# Patient Record
Sex: Male | Born: 1970 | Race: White | Hispanic: No | Marital: Married | Smoking: Current every day smoker
Health system: Southern US, Community
[De-identification: ages and names within clinical notes are randomized; demographics above are authoritative.]

## PROBLEM LIST (undated history)

## (undated) DIAGNOSIS — J45909 Unspecified asthma, uncomplicated: Secondary | ICD-10-CM

## (undated) DIAGNOSIS — J9383 Other pneumothorax: Secondary | ICD-10-CM

## (undated) DIAGNOSIS — G43909 Migraine, unspecified, not intractable, without status migrainosus: Secondary | ICD-10-CM

## (undated) HISTORY — PX: ROTATOR CUFF REPAIR: SHX139

---

## 2004-06-29 ENCOUNTER — Emergency Department: Payer: Self-pay | Admitting: Emergency Medicine

## 2004-09-04 ENCOUNTER — Ambulatory Visit: Payer: Self-pay | Admitting: Physician Assistant

## 2004-11-11 ENCOUNTER — Ambulatory Visit: Payer: Self-pay | Admitting: Unknown Physician Specialty

## 2007-06-28 ENCOUNTER — Ambulatory Visit: Payer: Self-pay | Admitting: Specialist

## 2007-07-30 ENCOUNTER — Other Ambulatory Visit: Payer: Self-pay

## 2007-07-30 ENCOUNTER — Emergency Department: Payer: Self-pay | Admitting: Emergency Medicine

## 2008-10-30 ENCOUNTER — Ambulatory Visit: Payer: Self-pay | Admitting: Orthopedic Surgery

## 2008-11-05 ENCOUNTER — Ambulatory Visit: Payer: Self-pay | Admitting: Orthopedic Surgery

## 2015-11-02 ENCOUNTER — Emergency Department
Admission: EM | Admit: 2015-11-02 | Discharge: 2015-11-02 | Disposition: A | Discharge status: Home / Self Care | Payer: Self-pay | Attending: Emergency Medicine | Admitting: Emergency Medicine

## 2015-11-02 ENCOUNTER — Encounter: Payer: Self-pay | Admitting: Emergency Medicine

## 2015-11-02 DIAGNOSIS — G43109 Migraine with aura, not intractable, without status migrainosus: Secondary | ICD-10-CM | POA: Insufficient documentation

## 2015-11-02 DIAGNOSIS — F17200 Nicotine dependence, unspecified, uncomplicated: Secondary | ICD-10-CM | POA: Insufficient documentation

## 2015-11-02 HISTORY — DX: Other pneumothorax: J93.83

## 2015-11-02 HISTORY — DX: Migraine, unspecified, not intractable, without status migrainosus: G43.909

## 2015-11-02 MED ORDER — DIPHENHYDRAMINE HCL 50 MG/ML IJ SOLN
INTRAMUSCULAR | Status: AC
  Administered 2015-11-02: 50 mg via INTRAVENOUS
  Filled 2015-11-02: qty 1

## 2015-11-02 MED ORDER — KETOROLAC TROMETHAMINE 30 MG/ML IJ SOLN
30.0000 mg | Freq: Once | INTRAMUSCULAR | Status: AC
  Administered 2015-11-02: 30 mg via INTRAVENOUS

## 2015-11-02 MED ORDER — METOCLOPRAMIDE HCL 5 MG/ML IJ SOLN
10.0000 mg | Freq: Once | INTRAMUSCULAR | Status: AC
  Administered 2015-11-02: 10 mg via INTRAVENOUS

## 2015-11-02 MED ORDER — ONDANSETRON 4 MG PO TBDP
ORAL_TABLET | ORAL | Status: AC
  Filled 2015-11-02: qty 1

## 2015-11-02 MED ORDER — METOCLOPRAMIDE HCL 5 MG/ML IJ SOLN
INTRAMUSCULAR | Status: AC
  Administered 2015-11-02: 10 mg via INTRAVENOUS
  Filled 2015-11-02: qty 2

## 2015-11-02 MED ORDER — SODIUM CHLORIDE 0.9 % IV BOLUS (SEPSIS)
1000.0000 mL | Freq: Once | INTRAVENOUS | Status: AC
  Administered 2015-11-02: 1000 mL via INTRAVENOUS

## 2015-11-02 MED ORDER — KETOROLAC TROMETHAMINE 30 MG/ML IJ SOLN
INTRAMUSCULAR | Status: AC
  Administered 2015-11-02: 30 mg via INTRAVENOUS
  Filled 2015-11-02: qty 1

## 2015-11-02 MED ORDER — ONDANSETRON 4 MG PO TBDP
4.0000 mg | ORAL_TABLET | Freq: Once | ORAL | Status: AC | PRN
  Administered 2015-11-02: 4 mg via ORAL

## 2015-11-02 MED ORDER — DIPHENHYDRAMINE HCL 50 MG/ML IJ SOLN
50.0000 mg | Freq: Once | INTRAMUSCULAR | Status: AC
  Administered 2015-11-02: 50 mg via INTRAVENOUS

## 2015-11-02 NOTE — ED Notes (Signed)
Pt has migraine for 3 days.  Has hx migraines.  Does not have any prescription medication any more.  Came up last night but reports he was told wait was 2 hours and he did not want to wait, so he went home to go to sleep and come back to day.  Has had vomiting 4 times today.  Has kept some liquids down as long as not moving a lot.  Has photophobia.

## 2015-11-02 NOTE — ED Provider Notes (Signed)
[facility] Emergency Department Provider Note  Time seen: 3:45 PM  I have reviewed the triage vital signs and the nursing notes.   HISTORY  Chief Complaint Migraine    HPI NOMAR BROAD is a 45 y.o. male with a past medical history of migraines who presents the emergency department approximately 2-3 days of headache. According to the patient approximately 3 days ago he was driving when he started seeing flashing lights then developed a headache, nausea and vomiting. States a history of migraines which feel similar to to his current headache. States he missed work yesterday, and his boss stated that if he did not bring a work note he would be fired for missing today. Patient states he came to the emergency department hoping for relief from the headache but also requires a note. Denies any weakness or numbness. Currently describes the headache as moderate to severe. Dull aching headache, global.     Past Medical History  Diagnosis Date  . Migraine   . Spontaneous pneumothorax     There are no active problems to display for this patient.   History reviewed. No pertinent past surgical history.  No current outpatient prescriptions on file.  Allergies Review of patient's allergies indicates no known allergies.  History reviewed. No pertinent family history.  Social History Social History  Substance Use Topics  . Smoking status: Current Every Day Smoker  . Smokeless tobacco: None  . Alcohol Use: No    Review of Systems Constitutional: Negative for fever. Eyes: Patient states flashing lights before headache started. Cardiovascular: Negative for chest pain. Respiratory: Negative for shortness of breath. Gastrointestinal: Negative for abdominal pain. Positive for nausea. Neurological: Significant headache. Denies focal weakness or numbness 10-point ROS otherwise negative.  ____________________________________________   PHYSICAL  EXAM:  VITAL SIGNS: ED Triage Vitals  Enc Vitals Group     BP 11/02/15 1502 141/91 mmHg     Pulse Rate 11/02/15 1502 78     Resp 11/02/15 1502 16     Temp 11/02/15 1502 98.4 F (36.9 C)     Temp Source 11/02/15 1502 Oral     SpO2 11/02/15 1502 96 %     Weight 11/02/15 1502 135 lb (61.236 kg)     Height 11/02/15 1502  (1.727 m)     Head Cir --      Peak Flow --      Pain Score 11/02/15 1502 9     Pain Loc --      Pain Edu? --      Excl. in GC? --     Constitutional: Alert and oriented. Well appearing and in no distress. Eyes: Moderate photophobia. ENT   Head: Normocephalic and atraumatic   Mouth/Throat: Mucous membranes are moist. Cardiovascular: Normal rate, regular rhythm. No murmur Respiratory: Normal respiratory effort without tachypnea nor retractions. Breath sounds are clear Gastrointestinal: Soft and nontender. No distention.  Musculoskeletal: Nontender with normal range of motion in all extremities.  Neurologic:  Normal speech and language. No gross focal neurologic deficits are appreciated. Equal grip strengths. No deficits identified. Skin:  Skin is warm, dry and intact.  Psychiatric: Mood and affect are normal. Speech and behavior are normal.   ____________________________________________     INITIAL IMPRESSION / ASSESSMENT AND PLAN / ED COURSE  Pertinent labs & imaging results that were available during my care of the patient were reviewed by me and considered in my medical decision making (see chart for details).  Patient presents with  symptoms consistent with a migraine headache. Intact neurologic exam, history of migraines. We will treat with a migraine cocktail of medications and monitor in the emergency department for improvement. Patient agreeable to plan.   ----------------------------------------- 4:41 PM on 11/02/2015 -----------------------------------------  Patient states he is feeling much better. We'll discharge  home. ____________________________________________   FINAL CLINICAL IMPRESSION(S) / ED DIAGNOSES  Migraine headache   Minna AntisKevin Deloise Marchant, MD 11/02/15 [phone]

## 2015-11-02 NOTE — Discharge Instructions (Signed)

## 2016-01-21 ENCOUNTER — Emergency Department
Admission: EM | Admit: 2016-01-21 | Discharge: 2016-01-21 | Disposition: A | Discharge status: Home / Self Care | Payer: Self-pay | Attending: Emergency Medicine | Admitting: Emergency Medicine

## 2016-01-21 ENCOUNTER — Encounter: Payer: Self-pay | Admitting: Emergency Medicine

## 2016-01-21 DIAGNOSIS — S70369A Insect bite (nonvenomous), unspecified thigh, initial encounter: Secondary | ICD-10-CM | POA: Insufficient documentation

## 2016-01-21 DIAGNOSIS — Y999 Unspecified external cause status: Secondary | ICD-10-CM | POA: Insufficient documentation

## 2016-01-21 DIAGNOSIS — S30861A Insect bite (nonvenomous) of abdominal wall, initial encounter: Secondary | ICD-10-CM | POA: Insufficient documentation

## 2016-01-21 DIAGNOSIS — Y929 Unspecified place or not applicable: Secondary | ICD-10-CM | POA: Insufficient documentation

## 2016-01-21 DIAGNOSIS — S20469A Insect bite (nonvenomous) of unspecified back wall of thorax, initial encounter: Secondary | ICD-10-CM | POA: Insufficient documentation

## 2016-01-21 DIAGNOSIS — W57XXXA Bitten or stung by nonvenomous insect and other nonvenomous arthropods, initial encounter: Secondary | ICD-10-CM | POA: Insufficient documentation

## 2016-01-21 DIAGNOSIS — Y939 Activity, unspecified: Secondary | ICD-10-CM | POA: Insufficient documentation

## 2016-01-21 DIAGNOSIS — F17200 Nicotine dependence, unspecified, uncomplicated: Secondary | ICD-10-CM | POA: Insufficient documentation

## 2016-01-21 MED ORDER — PREDNISONE 10 MG PO TABS: ORAL_TABLET | ORAL | Status: DC

## 2016-01-21 MED ORDER — TRIAMCINOLONE ACETONIDE 0.5 % EX OINT: 1.0000 "application " | TOPICAL_OINTMENT | Freq: Three times a day (TID) | CUTANEOUS | Status: DC

## 2016-01-21 NOTE — ED Notes (Signed)
Pt presents to ED with rash to back, leg and groin. Pt denies pain. Pt states he first thought they were mosquito bites. Pt states rash itches. Pt speaking in complete sentences and in no apparent distress in triage.

## 2016-01-21 NOTE — ED Notes (Signed)
States he developed a rash couple of days ago. Pt has a large area to groin and to right side of back with a fine rash to arms and legs no resp distress noted

## 2016-01-21 NOTE — Discharge Instructions (Signed)
Follow-up with [facility] if any continued problems. Begin taking prednisone 3 tablets today and every day until completely finished. Kenalog cream to areas to decrease itching. You may also take Benadryl one or 2 capsules every 6 hours as needed for itching. Do not take hot showers as this may increase your itching.

## 2016-01-21 NOTE — ED Provider Notes (Signed)
[facility] Emergency Department Provider Note   ____________________________________________  Time seen: Approximately 7:15 AM  I have reviewed the triage vital signs and the nursing notes.   HISTORY  Chief Complaint Rash   HPI Walter Morales is a 45 y.o. male patient is here with complaint of "rash" for several days. Patient states that he has multiple areas on the right side of his back and one area on his leg/groin area. Patient states that rash itches. Patient states that first he thought that these were mosquito bites but they have gotten more red during the time he's been scratching at them. He denies any fever or chills. He denies removal of any ticks. He denies any headache. Patient has not been taking any medication for itching.   Past Medical History  Diagnosis Date  . Migraine   . Spontaneous pneumothorax     There are no active problems to display for this patient.   History reviewed. No pertinent past surgical history.  Current Outpatient Rx  Name  Route  Sig  Dispense  Refill  . predniSONE (DELTASONE) 10 MG tablet      Take 3 tablets once a day for 3 days   9 tablet   0   . triamcinolone ointment (KENALOG) 0.5 %   Topical   Apply 1 application topically 3 (three) times daily.   15 g   0     Allergies Review of patient's allergies indicates no known allergies.  No family history on file.  Social History Social History  Substance Use Topics  . Smoking status: Current Every Day Smoker  . Smokeless tobacco: None  . Alcohol Use: No    Review of Systems Constitutional: No fever/chills Eyes: No visual changes. ENT: No sore throat.Negative for difficulty swallowing. Cardiovascular: Denies chest pain. Respiratory: Denies shortness of breath. Negative for wheezing. Gastrointestinal: No abdominal pain.  No nausea, no vomiting.   Musculoskeletal: Negative for back pain. Skin: Positive for rash. Neurological:  Negative for headaches, focal weakness or numbness.  10-point ROS otherwise negative.  ____________________________________________   PHYSICAL EXAM:  VITAL SIGNS: ED Triage Vitals  Enc Vitals Group     BP 01/21/16 0710 128/79 mmHg     Pulse Rate 01/21/16 0710 65     Resp 01/21/16 0710 18     Temp 01/21/16 0710 97.6 F (36.4 C)     Temp Source 01/21/16 0710 Oral     SpO2 01/21/16 0710 97 %     Weight 01/21/16 0710 135 lb (61.236 kg)     Height 01/21/16 0710  (1.727 m)     Head Cir --      Peak Flow --      Pain Score --      Pain Loc --      Pain Edu? --      Excl. in GC? --     Constitutional: Alert and oriented. Well appearing and in no acute distress. Eyes: Conjunctivae are normal. PERRL. EOMI. Head: Atraumatic. Nose: No congestion/rhinnorhea. Mouth/Throat: Mucous membranes are moist.  Oropharynx non-erythematous. Neck: No stridor.   Cardiovascular: Normal rate, regular rhythm. Grossly normal heart sounds.  Good peripheral circulation. Respiratory: Normal respiratory effort.  No retractions. Lungs CTAB. Musculoskeletal: No lower extremity tenderness nor edema.  No joint effusions. Neurologic:  Normal speech and language. No gross focal neurologic deficits are appreciated. No gait instability. Skin:  Skin is warm, dry. There are individual red, papules on the areas described above.  There is no pustules or surrounding erythema suggesting cellulitis. Each area has the appearance of an insect bite. No foreign bodies were noted. Psychiatric: Mood and affect are normal. Speech and behavior are normal.  ____________________________________________   LABS (all labs ordered are listed, but only abnormal results are displayed)  Labs Reviewed - No data to display   PROCEDURES  Procedure(s) performed: None  Critical Care performed: No  ____________________________________________   INITIAL IMPRESSION / ASSESSMENT AND PLAN / ED COURSE  Pertinent labs & imaging  results that were available during my care of the patient were reviewed by me and considered in my medical decision making (see chart for details).  Patient was given a prescription for prednisone, Kenalog and told to continue taking over-the-counter Benadryl as needed for itching. Patient is [facility] or acute-care if any continued problems. ____________________________________________   FINAL CLINICAL IMPRESSION(S) / ED DIAGNOSES  Final diagnoses:  Multiple insect bites      NEW MEDICATIONS STARTED DURING THIS VISIT:  Discharge Medication List as of 01/21/2016  7:30 AM    START taking these medications   Details  predniSONE (DELTASONE) 10 MG tablet Take 3 tablets once a day for 3 days, Print    triamcinolone ointment (KENALOG) 0.5 % Apply 1 application topically 3 (three) times daily., Starting 01/21/2016, Until Discontinued, Print         Note:  This document was prepared using Dragon voice recognition software and may include unintentional dictation errors.    Tommi Rumpshonda L Summers, PA-C 01/21/16 1509  Phineas SemenGraydon Goodman, MD 01/21/16 [phone]

## 2016-01-26 ENCOUNTER — Emergency Department: Payer: Self-pay

## 2016-01-26 ENCOUNTER — Emergency Department
Admission: EM | Admit: 2016-01-26 | Discharge: 2016-01-26 | Disposition: A | Discharge status: Home / Self Care | Payer: Self-pay | Attending: Emergency Medicine | Admitting: Emergency Medicine

## 2016-01-26 DIAGNOSIS — G43109 Migraine with aura, not intractable, without status migrainosus: Secondary | ICD-10-CM | POA: Insufficient documentation

## 2016-01-26 DIAGNOSIS — F17200 Nicotine dependence, unspecified, uncomplicated: Secondary | ICD-10-CM | POA: Insufficient documentation

## 2016-01-26 LAB — COMPREHENSIVE METABOLIC PANEL
ALK PHOS: 72 U/L (ref 38–126)
ALT: 12 U/L — AB (ref 17–63)
AST: 21 U/L (ref 15–41)
Albumin: 3.9 g/dL (ref 3.5–5.0)
Anion gap: 6 (ref 5–15)
BUN: 9 mg/dL (ref 6–20)
CALCIUM: 9 mg/dL (ref 8.9–10.3)
CHLORIDE: 108 mmol/L (ref 101–111)
CO2: 25 mmol/L (ref 22–32)
CREATININE: 0.73 mg/dL (ref 0.61–1.24)
Glucose, Bld: 99 mg/dL (ref 65–99)
Potassium: 3.9 mmol/L (ref 3.5–5.1)
Sodium: 139 mmol/L (ref 135–145)
Total Bilirubin: 0.6 mg/dL (ref 0.3–1.2)
Total Protein: 6.7 g/dL (ref 6.5–8.1)

## 2016-01-26 LAB — CBC WITH DIFFERENTIAL/PLATELET
Basophils Absolute: 0.1 10*3/uL (ref 0–0.1)
Basophils Relative: 1 %
EOS ABS: 0.1 10*3/uL (ref 0–0.7)
EOS PCT: 2 %
HCT: 47.3 % (ref 40.0–52.0)
Hemoglobin: 16.1 g/dL (ref 13.0–18.0)
LYMPHS ABS: 2.5 10*3/uL (ref 1.0–3.6)
Lymphocytes Relative: 30 %
MCH: 31 pg (ref 26.0–34.0)
MCHC: 34 g/dL (ref 32.0–36.0)
MCV: 91.1 fL (ref 80.0–100.0)
MONOS PCT: 9 %
Monocytes Absolute: 0.8 10*3/uL (ref 0.2–1.0)
Neutro Abs: 4.9 10*3/uL (ref 1.4–6.5)
Neutrophils Relative %: 58 %
PLATELETS: 187 10*3/uL (ref 150–440)
RBC: 5.19 MIL/uL (ref 4.40–5.90)
RDW: 13.5 % (ref 11.5–14.5)
WBC: 8.4 10*3/uL (ref 3.8–10.6)

## 2016-01-26 MED ORDER — METOCLOPRAMIDE HCL 5 MG/ML IJ SOLN
10.0000 mg | Freq: Once | INTRAMUSCULAR | Status: AC
  Administered 2016-01-26: 10 mg via INTRAVENOUS
  Filled 2016-01-26: qty 2

## 2016-01-26 MED ORDER — KETOROLAC TROMETHAMINE 30 MG/ML IJ SOLN
30.0000 mg | Freq: Once | INTRAMUSCULAR | Status: AC
  Administered 2016-01-26: 30 mg via INTRAVENOUS
  Filled 2016-01-26: qty 1

## 2016-01-26 MED ORDER — DIPHENHYDRAMINE HCL 50 MG/ML IJ SOLN
25.0000 mg | Freq: Once | INTRAMUSCULAR | Status: AC
  Administered 2016-01-26: 25 mg via INTRAVENOUS
  Filled 2016-01-26: qty 1

## 2016-01-26 MED ORDER — SODIUM CHLORIDE 0.9 % IV BOLUS (SEPSIS)
1000.0000 mL | Freq: Once | INTRAVENOUS | Status: AC
  Administered 2016-01-26: 1000 mL via INTRAVENOUS

## 2016-01-26 MED ORDER — ONDANSETRON HCL 4 MG/2ML IJ SOLN
4.0000 mg | Freq: Once | INTRAMUSCULAR | Status: AC
  Administered 2016-01-26: 4 mg via INTRAVENOUS
  Filled 2016-01-26: qty 2

## 2016-01-26 NOTE — ED Notes (Signed)
States he is having to left side of head radiates into left side of neck for 3 days  Had some vomiting yesterday  Denies any fever or trauma

## 2016-01-26 NOTE — ED Notes (Signed)
Pt c/o left sided neck pain that is causing a HA for the past 3 days..Marland Kitchen

## 2016-01-26 NOTE — Discharge Instructions (Signed)
Migraine Headache A migraine headache is very bad, throbbing pain on one or both sides of your head. Talk to your doctor about what things may bring on (trigger) your migraine headaches. HOME CARE  Only take medicines as told by your doctor.  Lie down in a dark, quiet room when you have a migraine.  Keep a journal to find out if certain things bring on migraine headaches. For example, write down:  What you eat and drink.  How much sleep you get.  Any change to your diet or medicines.  Lessen how much alcohol you drink.  Quit smoking if you smoke.  Get enough sleep.  Lessen any stress in your life.  Keep lights dim if bright lights bother you or make your migraines worse. GET HELP RIGHT AWAY IF:   Your migraine becomes really bad.  You have a fever.  You have a stiff neck.  You have trouble seeing.  Your muscles are weak, or you lose muscle control.  You lose your balance or have trouble walking.  You feel like you will pass out (faint), or you pass out.  You have really bad symptoms that are different than your first symptoms. MAKE SURE YOU:   Understand these instructions.  Will watch your condition.  Will get help right away if you are not doing well or get worse.   This information is not intended to replace advice given to you by your health care provider. Make sure you discuss any questions you have with your health care provider.   Document Released: 05/04/2008 Document Revised: 10/18/2011 Document Reviewed: 04/02/2013 Elsevier Interactive Patient Education 2016 [employer]   [facility] if any continued problems with your headache. Increase fluids.

## 2016-01-26 NOTE — ED Provider Notes (Signed)
[facility] Emergency Department Provider Note  ____________________________________________  Time seen: Approximately 10:51 AM  I have reviewed the triage vital signs and the nursing notes.   HISTORY  Chief Complaint Neck Pain   HPI Walter Morales is a 45 y.o. male with a 30 year smoking history who presents to the emergency department with complaints of a headache and neck pain. The neck pain preceded the headache late last night and he expreienced nausea and vomiting this morning. He tried a hot shower and Asprin with no relief. Both the headache and neck pain are localized to the left side. He states that he has a history of migraines, but his last one was 2-3 years ago and they were usually relieved with Asprin. He admits to photophobia, floaters, and "flashers" in the left eye only, which are all usual for his past migraines. He denies recent trauma, recent illness, dizziness, fatigue, phonophobia, muscle pain or joint pain, and numbness or tingling in upper or lower extremities. He also admits to an occasional cough for the past month and occasional chest pain that is relieved with rest. Currently patient rates his pain as 8 out of 10.  He was seen in this emergency department last week for insect bites and was given a topical steroid and has experienced relief. He denies recent tick bite.    Past Medical History  Diagnosis Date  . Migraine   . Spontaneous pneumothorax     There are no active problems to display for this patient.   History reviewed. No pertinent past surgical history.  Current Outpatient Rx  Name  Route  Sig  Dispense  Refill  . predniSONE (DELTASONE) 10 MG tablet      Take 3 tablets once a day for 3 days   9 tablet   0   . triamcinolone ointment (KENALOG) 0.5 %   Topical   Apply 1 application topically 3 (three) times daily.   15 g   0     Allergies Review of patient's allergies indicates no known allergies.  No  family history on file.  Social History Social History  Substance Use Topics  . Smoking status: Current Every Day Smoker  . Smokeless tobacco: None  . Alcohol Use: No    Review of Systems Constitutional: No fever/chills Eyes: To give the positive for floaters and flashing. ENT: No sore throat. Cardiovascular: Denies chest pain. Respiratory: Denies shortness of breath. Gastrointestinal: No abdominal pain.  Positive nausea, positive vomiting.   Musculoskeletal: Negative for back pain. Skin: Negative for rash. Neurological: Positive for left-sided headaches, no focal weakness or numbness.  10-point ROS otherwise negative.  ____________________________________________   PHYSICAL EXAM:  VITAL SIGNS: ED Triage Vitals  Enc Vitals Group     BP 01/26/16 1045 109/81 mmHg     Pulse Rate 01/26/16 1045 72     Resp 01/26/16 1045 16     Temp 01/26/16 1045 97.7 F (36.5 C)     Temp Source 01/26/16 1045 Oral     SpO2 01/26/16 1045 95 %     Weight 01/26/16 1045 135 lb (61.236 kg)     Height 01/26/16 1045 5\' 8"  (1.727 m)     Head Cir --      Peak Flow --      Pain Score 01/26/16 1045 8     Pain Loc --      Pain Edu? --      Excl. in GC? --  Constitutional: Alert and oriented. Well appearing and in no acute distress. Eyes: Conjunctivae are normal. PERRL. EOMI. Head: Atraumatic. Nose: No congestion/rhinnorhea. Mouth/Throat: Mucous membranes are moist.  Oropharynx non-erythematous. Neck: No stridor.  Cervical spine on palpation there is no point tenderness on palpation there is some generalized soft tissue tenderness noted mostly on the left lateral side. There is no restriction with range of motion of the neck. Hematological/Lymphatic/Immunilogical: No cervical lymphadenopathy. Cardiovascular: Normal rate, regular rhythm. Grossly normal heart sounds.  Good peripheral circulation. Respiratory: Normal respiratory effort.  No retractions. Lungs CTAB. Musculoskeletal: Moves upper  and lower extremities without any difficulty. Normal gait was noted. Neurologic:  Normal speech and language. No gross focal neurologic deficits are appreciated. No gait instability. Cranial nerves II through XII grossly intact. Reflexes patella tendons 2+ bilaterally. Skin:  Skin is warm, dry and intact. No rash noted. Psychiatric: Mood and affect are normal. Speech and behavior are normal.  ____________________________________________   LABS (all labs ordered are listed, but only abnormal results are displayed)  Labs Reviewed  COMPREHENSIVE METABOLIC PANEL - Abnormal; Notable for the following:    ALT 12 (*)    All other components within normal limits  CBC WITH DIFFERENTIAL/PLATELET    RADIOLOGY CT head per radiologist shows no evidence of acute intracranial abnormality. Chest x-ray per radiologist shows biapical scarring.  I, Tommi Rumps, personally viewed and evaluated these images (plain radiographs) as part of my medical decision making, as well as reviewing the written report by the radiologist.  ____________________________________________   PROCEDURES  Procedure(s) performed: None  Critical Care performed: No  ____________________________________________   INITIAL IMPRESSION / ASSESSMENT AND PLAN / ED COURSE  Pertinent labs & imaging results that were available during my care of the patient were reviewed by me and considered in my medical decision making (see chart for details).  Patient was given IV fluids while in the emergency room and Cipro and was given prior to his CT scan which had alleviated his nausea. After a liter of normal saline, Toradol 30 mg IV, Reglan 10 mg IV and Benadryl 25 g IV patient experienced a remarked decrease in his headache and was able to tolerate light, drink fluids and was ready to go home. Patient is follow-up with his primary care doctor or [facility] if any continued problems with headaches .  FINAL CLINICAL IMPRESSION(S) /  ED DIAGNOSES  Final diagnoses:  Migraine with aura and without status migrainosus, not intractable      NEW MEDICATIONS STARTED DURING THIS VISIT:  Discharge Medication List as of 01/26/2016  1:47 PM       Note:  This document was prepared using Dragon voice recognition software and may include unintentional dictation errors.    Tommi Rumps, PA-C 01/26/16 1618  Sharman Cheek, MD 01/27/16 2014

## 2016-03-17 ENCOUNTER — Emergency Department
Admission: EM | Admit: 2016-03-17 | Discharge: 2016-03-17 | Disposition: A | Discharge status: Home / Self Care | Payer: Self-pay | Attending: Emergency Medicine | Admitting: Emergency Medicine

## 2016-03-17 ENCOUNTER — Encounter: Payer: Self-pay | Admitting: *Deleted

## 2016-03-17 DIAGNOSIS — F17200 Nicotine dependence, unspecified, uncomplicated: Secondary | ICD-10-CM | POA: Insufficient documentation

## 2016-03-17 DIAGNOSIS — R519 Headache, unspecified: Secondary | ICD-10-CM

## 2016-03-17 DIAGNOSIS — R51 Headache: Secondary | ICD-10-CM

## 2016-03-17 DIAGNOSIS — G44219 Episodic tension-type headache, not intractable: Secondary | ICD-10-CM | POA: Insufficient documentation

## 2016-03-17 MED ORDER — BUTALBITAL-APAP-CAFFEINE 50-325-40 MG PO TABS
1.0000 | ORAL_TABLET | Freq: Once | ORAL | Status: AC
  Administered 2016-03-17: 1 via ORAL
  Filled 2016-03-17: qty 1

## 2016-03-17 MED ORDER — BUTALBITAL-APAP-CAFFEINE 50-325-40 MG PO TABS: 1.0000 | ORAL_TABLET | Freq: Four times a day (QID) | ORAL | 0 refills | Status: DC | PRN

## 2016-03-17 NOTE — ED Provider Notes (Signed)
[facility] Emergency Department Provider Note  ____________________________________________  Time seen: Approximately 9:02 PM  I have reviewed the triage vital signs and the nursing notes.   HISTORY  Chief Complaint Migraine    HPI Walter Morales is a 45 y.o. male since for evaluation of a migraine headache. Patient states he's had a headache sudden onset this morning of floaters noted his eye but as the days progressed he states that he feels much better. He is scheduled for an eye exam tomorrow. Negative loss of consciousness slurred speech numbness or tingling.   Past Medical History:  Diagnosis Date  . Migraine   . Spontaneous pneumothorax     There are no active problems to display for this patient.   No past surgical history on file.  Prior to Admission medications   Medication Sig Start Date End Date Taking? Authorizing Provider  butalbital-acetaminophen-caffeine (FIORICET) 50-325-40 MG tablet Take 1-2 tablets by mouth every 6 (six) hours as needed for headache. 03/17/16   Evangeline Dakin, PA-C  predniSONE (DELTASONE) 10 MG tablet Take 3 tablets once a day for 3 days 01/21/16   Tommi Rumps, PA-C  triamcinolone ointment (KENALOG) 0.5 % Apply 1 application topically 3 (three) times daily. 01/21/16   Tommi Rumps, PA-C    Allergies Review of patient's allergies indicates no known allergies.  History reviewed. No pertinent family history.  Social History Social History  Substance Use Topics  . Smoking status: Current Every Day Smoker  . Smokeless tobacco: Not on file  . Alcohol use No    Review of Systems Constitutional: No fever/chills Eyes: No visual changes. ENT: No sore throat. Cardiovascular: Denies chest pain. Respiratory: Denies shortness of breath. Gastrointestinal: No abdominal pain.  No nausea, no vomiting.  No diarrhea.  No constipation. Genitourinary: Negative for dysuria. Musculoskeletal: Negative for back  pain. Skin: Negative for rash. Neurological: Positive for headaches, negative for focal weakness or numbness.  10-point ROS otherwise negative.  ____________________________________________   PHYSICAL EXAM:  VITAL SIGNS: ED Triage Vitals  Enc Vitals Group     BP 03/17/16 2015 126/79     Pulse Rate 03/17/16 2015 68     Resp 03/17/16 2015 18     Temp 03/17/16 2015 98.4 F (36.9 C)     Temp Source 03/17/16 2015 Oral     SpO2 03/17/16 2015 97 %     Weight 03/17/16 2015 135 lb (61.2 kg)     Height 03/17/16 2015  (1.753 m)     Head Circumference --      Peak Flow --      Pain Score 03/17/16 2017 7     Pain Loc --      Pain Edu? --      Excl. in GC? --     Constitutional: Alert and oriented. Well appearing and in no acute distress. Eyes: Conjunctivae are normal. PERRL. EOMI. Head: Atraumatic. Nose: No congestion/rhinnorhea. Mouth/Throat: Mucous membranes are moist.  Oropharynx non-erythematous. Neck: No stridor.   Cardiovascular: Normal rate, regular rhythm. Grossly normal heart sounds.  Good peripheral circulation. Respiratory: Normal respiratory effort.  No retractions. Lungs CTAB. Musculoskeletal: No lower extremity tenderness nor edema.  No joint effusions. Neurologic:  Normal speech and language. No gross focal neurologic deficits are appreciated. No gait instability. Skin:  Skin is warm, dry and intact. No rash noted. Psychiatric: Mood and affect are normal. Speech and behavior are normal.  ____________________________________________   LABS (all labs ordered are listed,  but only abnormal results are displayed)  Labs Reviewed - No data to display ____________________________________________  EKG   ____________________________________________  RADIOLOGY   ____________________________________________   PROCEDURES  Procedure(s) performed: None  Critical Care performed: No  ____________________________________________   INITIAL IMPRESSION /  ASSESSMENT AND PLAN / ED COURSE  Pertinent labs & imaging results that were available during my care of the patient were reviewed by me and considered in my medical decision making (see chart for details).  Probable tension headache. Rx given for Fioricet every 6 hours. Work excuse 24 hours given. Patient follow-up with his PCP keep his eye appointment as scheduled. He voices no other emergency medical complaints at this time.  Clinical Course    ____________________________________________   FINAL CLINICAL IMPRESSION(S) / ED DIAGNOSES  Final diagnoses:  Bad headache  Episodic tension-type headache, not intractable     This chart was dictated using voice recognition software/Dragon. Despite best efforts to proofread, errors can occur which can change the meaning. Any change was purely unintentional.    Evangeline Dakinharles M Johnie Makki, PA-C 03/17/16 2134    Sharman CheekPhillip Stafford, MD 03/20/16 0800

## 2016-03-17 NOTE — ED Triage Notes (Signed)
Pt ambulatory to triage.  Pt reports a headache after waking up this morning.  Pt took tylenol without pain relief.  Pt alert.  Speech clear.

## 2016-03-17 NOTE — ED Notes (Addendum)
Patient has hx of migraines and reports seeing stars during severe pain but at hospital right now is not experiencing that.

## 2016-05-25 ENCOUNTER — Encounter: Payer: Self-pay | Admitting: Emergency Medicine

## 2016-05-25 DIAGNOSIS — F17210 Nicotine dependence, cigarettes, uncomplicated: Secondary | ICD-10-CM | POA: Insufficient documentation

## 2016-05-25 DIAGNOSIS — Z5321 Procedure and treatment not carried out due to patient leaving prior to being seen by health care provider: Secondary | ICD-10-CM | POA: Insufficient documentation

## 2016-05-25 DIAGNOSIS — R5383 Other fatigue: Secondary | ICD-10-CM | POA: Insufficient documentation

## 2016-05-25 LAB — COMPREHENSIVE METABOLIC PANEL
ALBUMIN: 4.1 g/dL (ref 3.5–5.0)
ALT: 11 U/L — ABNORMAL LOW (ref 17–63)
ANION GAP: 9 (ref 5–15)
AST: 18 U/L (ref 15–41)
Alkaline Phosphatase: 78 U/L (ref 38–126)
BUN: 11 mg/dL (ref 6–20)
CHLORIDE: 103 mmol/L (ref 101–111)
CO2: 24 mmol/L (ref 22–32)
Calcium: 9.3 mg/dL (ref 8.9–10.3)
Creatinine, Ser: 0.72 mg/dL (ref 0.61–1.24)
GFR calc Af Amer: >60 mL/min (ref >60)
GFR calc non Af Amer: >60 mL/min (ref >60)
GLUCOSE: 117 mg/dL — AB (ref 65–99)
POTASSIUM: 3.6 mmol/L (ref 3.5–5.1)
SODIUM: 136 mmol/L (ref 135–145)
TOTAL PROTEIN: 7.7 g/dL (ref 6.5–8.1)
Total Bilirubin: 0.5 mg/dL (ref 0.3–1.2)

## 2016-05-25 LAB — CBC WITH DIFFERENTIAL/PLATELET
BASOS ABS: 0.1 10*3/uL (ref 0–0.1)
BASOS PCT: 1 %
EOS ABS: 0.1 10*3/uL (ref 0–0.7)
Eosinophils Relative: 1 %
HCT: 50.8 % (ref 40.0–52.0)
Hemoglobin: 17.1 g/dL (ref 13.0–18.0)
Lymphocytes Relative: 34 %
Lymphs Abs: 3.6 10*3/uL (ref 1.0–3.6)
MCH: 31.1 pg (ref 26.0–34.0)
MCHC: 33.7 g/dL (ref 32.0–36.0)
MCV: 92.4 fL (ref 80.0–100.0)
MONO ABS: 0.9 10*3/uL (ref 0.2–1.0)
MONOS PCT: 8 %
NEUTROS ABS: 5.9 10*3/uL (ref 1.4–6.5)
NEUTROS PCT: 56 %
Platelets: 253 10*3/uL (ref 150–440)
RBC: 5.5 MIL/uL (ref 4.40–5.90)
RDW: 13.9 % (ref 11.5–14.5)
WBC: 10.6 10*3/uL (ref 3.8–10.6)

## 2016-05-25 LAB — LIPASE, BLOOD: Lipase: 21 U/L (ref 11–51)

## 2016-05-25 LAB — ETHANOL: Alcohol, Ethyl (B): <5 mg/dL (ref <5)

## 2016-05-25 NOTE — ED Triage Notes (Addendum)
Pt presents to ED with extreme fatigue since last Thursday with decrease in appetite, body aches, frequent headaches, and rapid weight loss. Pt states he was around 145lbs and is currently 118lbs. Pt states he has just feeling badly and although he doesn't like going to the doctor he feels like he needs to get checked out since he is not improving. Denies v/d or fever.

## 2016-05-26 ENCOUNTER — Telehealth: Payer: Self-pay | Admitting: Emergency Medicine

## 2016-05-26 ENCOUNTER — Emergency Department
Admission: EM | Admit: 2016-05-26 | Discharge: 2016-05-26 | Disposition: A | Discharge status: Home / Self Care | Payer: Self-pay | Attending: Emergency Medicine | Admitting: Emergency Medicine

## 2016-05-26 ENCOUNTER — Emergency Department: Payer: Self-pay

## 2016-05-26 ENCOUNTER — Encounter: Payer: Self-pay | Admitting: *Deleted

## 2016-05-26 DIAGNOSIS — R079 Chest pain, unspecified: Secondary | ICD-10-CM | POA: Insufficient documentation

## 2016-05-26 DIAGNOSIS — M545 Low back pain: Secondary | ICD-10-CM | POA: Insufficient documentation

## 2016-05-26 DIAGNOSIS — R634 Abnormal weight loss: Secondary | ICD-10-CM | POA: Insufficient documentation

## 2016-05-26 DIAGNOSIS — R0602 Shortness of breath: Secondary | ICD-10-CM | POA: Insufficient documentation

## 2016-05-26 DIAGNOSIS — Z79899 Other long term (current) drug therapy: Secondary | ICD-10-CM | POA: Insufficient documentation

## 2016-05-26 DIAGNOSIS — F17210 Nicotine dependence, cigarettes, uncomplicated: Secondary | ICD-10-CM | POA: Insufficient documentation

## 2016-05-26 DIAGNOSIS — R630 Anorexia: Secondary | ICD-10-CM | POA: Insufficient documentation

## 2016-05-26 DIAGNOSIS — R5383 Other fatigue: Secondary | ICD-10-CM | POA: Insufficient documentation

## 2016-05-26 NOTE — ED Notes (Signed)
Pt states understanding of discharge instructions. NAD noted at this time.  

## 2016-05-26 NOTE — ED Provider Notes (Signed)
[facility] Emergency Department Provider Note   ____________________________________________    I have reviewed the triage vital signs and the nursing notes.   HISTORY  Chief Complaint Fatigue     HPI Walter SprayChristopher M Morales is a 45 y.o. male who presents with complaints of fatigue, decreased appetite and weight loss. He reports to me that this is been occurring over the last several months. He denies chest pain to me although I note that he did note intermittent chest pains to triage nurse. He denies shortness of breath today. He is a prior smoker. He denies fevers or chills. No cough. No nausea vomiting or abdominal pain.   Past Medical History:  Diagnosis Date  . Migraine   . Spontaneous pneumothorax     There are no active problems to display for this patient.   Past Surgical History:  Procedure Laterality Date  . ROTATOR CUFF REPAIR      Prior to Admission medications   Medication Sig Start Date End Date Taking? Authorizing Provider  butalbital-acetaminophen-caffeine (FIORICET) 50-325-40 MG tablet Take 1-2 tablets by mouth every 6 (six) hours as needed for headache. 03/17/16   Evangeline Dakinharles M Beers, PA-C  predniSONE (DELTASONE) 10 MG tablet Take 3 tablets once a day for 3 days 01/21/16   Tommi Rumpshonda L Summers, PA-C  triamcinolone ointment (KENALOG) 0.5 % Apply 1 application topically 3 (three) times daily. 01/21/16   Tommi Rumpshonda L Summers, PA-C     Allergies Review of patient's allergies indicates no known allergies.  History reviewed. No pertinent family history.  Social History Social History  Substance Use Topics  . Smoking status: Current Every Day Smoker    Packs/day: 0.50    Types: Cigarettes  . Smokeless tobacco: Former NeurosurgeonUser  . Alcohol use No    Review of Systems  Constitutional: No fever/chills. Weight loss as above Eyes: No visual changes.  ENT: No sore throat. Cardiovascular: Denies chest pain. Respiratory: Denies shortness of  breath. Gastrointestinal: No abdominal pain.  No nausea, no vomiting.   Genitourinary: Negative for dysuria. Musculoskeletal: Negative for back pain. Skin: Negative for rash. Neurological: Negative for headaches or weakness  10-point ROS otherwise negative.  ____________________________________________   PHYSICAL EXAM:  VITAL SIGNS: ED Triage Vitals  Enc Vitals Group     BP 05/26/16 1253 (!) 152/98     Pulse Rate 05/26/16 1253 (!) 102     Resp 05/26/16 1253 18     Temp 05/26/16 1253 97.7 F (36.5 C)     Temp Source 05/26/16 1253 Oral     SpO2 05/26/16 1253 98 %     Weight 05/26/16 1254 118 lb (53.5 kg)     Height 05/26/16 1254 5\' 8"  (1.727 m)     Head Circumference --      Peak Flow --      Pain Score 05/26/16 1254 8     Pain Loc --      Pain Edu? --      Excl. in GC? --     Constitutional: Alert and oriented. No acute distress.  Eyes: Conjunctivae are normal.   Nose: No congestion/rhinnorhea. Mouth/Throat: Mucous membranes are moist.    Cardiovascular: Normal rate, regular rhythm. Grossly normal heart sounds.  Good peripheral circulation. Respiratory: Normal respiratory effort.  No retractions. Lungs CTAB. Gastrointestinal: Soft and nontender. No distention.  No CVA tenderness. Genitourinary: deferred Musculoskeletal: No lower extremity tenderness nor edema.  Warm and well perfused Neurologic:  Normal speech and language. No gross  focal neurologic deficits are appreciated.  Skin:  Skin is warm, dry and intact. No rash noted. Psychiatric: Mood and affect are normal. Speech and behavior are normal.  ____________________________________________   LABS (all labs ordered are listed, but only abnormal results are displayed)  Labs Reviewed - No data to display ____________________________________________  EKG  ED ECG REPORT I, Jene Every, the attending physician, personally viewed and interpreted this ECG.  Date: 05/26/2016 EKG Time: 1 PM Rate:  92 Rhythm: normal sinus rhythm QRS Axis: Rightward Intervals: normal ST/T Wave abnormalities: normal Conduction Disturbances: none Narrative Interpretation: Pulmonary disease pattern  ____________________________________________  RADIOLOGY  Chest x-ray no nodules ____________________________________________   PROCEDURES  Procedure(s) performed: No    Critical Care performed: No ____________________________________________   INITIAL IMPRESSION / ASSESSMENT AND PLAN / ED COURSE  Pertinent labs & imaging results that were available during my care of the patient were reviewed by me and considered in my medical decision making (see chart for details).  Overall patient well-appearing with no emergent condition. He has no chest pain in the emergency department and denies chest pain today. Certainly his weight loss is concerning and I strongly encouraged outpatient follow-up for further evaluation  Clinical Course   ____________________________________________   FINAL CLINICAL IMPRESSION(S) / ED DIAGNOSES  Final diagnoses:  Fatigue, unspecified type  Weight loss      NEW MEDICATIONS STARTED DURING THIS VISIT:  Discharge Medication List as of 05/26/2016  3:33 PM       Note:  This document was prepared using Dragon voice recognition software and may include unintentional dictation errors.    Jene Every, MD 05/26/16 [phone]

## 2016-05-26 NOTE — ED Triage Notes (Signed)
Pt arrives with complaints of fatigue, states feeling weak, decrease in appetite, body aches, headaches and weight loss, states he was seen in ED last night but did not wait to see provider, pt awake and alert in no acute distress

## 2016-05-26 NOTE — ED Notes (Signed)
Pt in via triage with complaints of fatigue, decrease in appetite, weight loss over the last week.  Pt reports onset of intermittent chest pain since this morning w/ some shortness of breath and lower back pain.  Pt A/Ox4, no immediate distress at this time.

## 2016-07-16 ENCOUNTER — Emergency Department: Payer: Self-pay

## 2016-07-16 ENCOUNTER — Encounter: Payer: Self-pay | Admitting: Emergency Medicine

## 2016-07-16 ENCOUNTER — Emergency Department
Admission: EM | Admit: 2016-07-16 | Discharge: 2016-07-16 | Disposition: A | Discharge status: Home / Self Care | Payer: Self-pay | Attending: Emergency Medicine | Admitting: Emergency Medicine

## 2016-07-16 DIAGNOSIS — Z79899 Other long term (current) drug therapy: Secondary | ICD-10-CM | POA: Insufficient documentation

## 2016-07-16 DIAGNOSIS — J181 Lobar pneumonia, unspecified organism: Secondary | ICD-10-CM | POA: Insufficient documentation

## 2016-07-16 DIAGNOSIS — Z720 Tobacco use: Secondary | ICD-10-CM

## 2016-07-16 DIAGNOSIS — F17210 Nicotine dependence, cigarettes, uncomplicated: Secondary | ICD-10-CM | POA: Insufficient documentation

## 2016-07-16 DIAGNOSIS — J189 Pneumonia, unspecified organism: Secondary | ICD-10-CM

## 2016-07-16 DIAGNOSIS — M549 Dorsalgia, unspecified: Secondary | ICD-10-CM | POA: Insufficient documentation

## 2016-07-16 MED ORDER — METHYLPREDNISOLONE SODIUM SUCC 125 MG IJ SOLR
125.0000 mg | Freq: Once | INTRAMUSCULAR | Status: AC
  Administered 2016-07-16: 125 mg via INTRAMUSCULAR
  Filled 2016-07-16: qty 2

## 2016-07-16 MED ORDER — LEVOFLOXACIN 750 MG PO TABS: 750.0000 mg | ORAL_TABLET | Freq: Every day | ORAL | 0 refills | Status: DC

## 2016-07-16 MED ORDER — IPRATROPIUM-ALBUTEROL 0.5-2.5 (3) MG/3ML IN SOLN
3.0000 mL | Freq: Once | RESPIRATORY_TRACT | Status: AC
  Administered 2016-07-16: 3 mL via RESPIRATORY_TRACT
  Filled 2016-07-16: qty 3

## 2016-07-16 MED ORDER — LEVOFLOXACIN 500 MG PO TABS
750.0000 mg | ORAL_TABLET | Freq: Once | ORAL | Status: AC
  Administered 2016-07-16: 750 mg via ORAL
  Filled 2016-07-16: qty 1

## 2016-07-16 MED ORDER — PREDNISONE 10 MG PO TABS: ORAL_TABLET | ORAL | 0 refills | Status: DC

## 2016-07-16 MED ORDER — ALBUTEROL SULFATE HFA 108 (90 BASE) MCG/ACT IN AERS: 1.0000 | INHALATION_SPRAY | Freq: Four times a day (QID) | RESPIRATORY_TRACT | 0 refills | Status: DC | PRN

## 2016-07-16 NOTE — ED Triage Notes (Signed)
Pt ambulatory to triage, reports back pain, HA, and cold sx.  Pt reports sent home from work for evaluation.  Pt NAD at this time

## 2016-07-16 NOTE — ED Notes (Signed)
Pt Is having no signs of a reaction and is in no acute distress at this time.

## 2016-07-16 NOTE — ED Provider Notes (Signed)
[facility] Emergency Department Provider Note  ____________________________________________  Time seen: Approximately 8:45 PM  I have reviewed the triage vital signs and the nursing notes.   HISTORY  Chief Complaint Back Pain and Headache    HPI Walter Morales is a 45 y.o. male , NAD, presents emergency Department with 2-3 day history of cold symptoms. Patient states he has had sinus headache, sinus pressure, runny nose, cough, congestion and wheezing for approximately 3 days. States he had onset of right sided middle back pain over the last day or so. Has had no fevers, chills or body aches. Does smoke tobacco cigarettes on a daily basis but states over the last 3 months he has decreased to less than a pack a day as compared to a previous 2 pack per day habit. He also notes that over the last few days has been sick he has only smoked 3 cigarettes. Has had no abdominal pain, nausea, vomiting. Denies any rashes. No known sick contacts. Does note he was sent home from work due to his illness and needs to provide a medical note to return.   Past Medical History:  Diagnosis Date  . Migraine   . Spontaneous pneumothorax     There are no active problems to display for this patient.   Past Surgical History:  Procedure Laterality Date  . ROTATOR CUFF REPAIR      Prior to Admission medications   Medication Sig Start Date End Date Taking? Authorizing Provider  albuterol (PROVENTIL HFA;VENTOLIN HFA) 108 (90 Base) MCG/ACT inhaler Inhale 1-2 puffs into the lungs every 6 (six) hours as needed for wheezing or shortness of breath. 07/16/16   Leelynn Whetsel L Rayn Enderson, PA-C  butalbital-acetaminophen-caffeine (FIORICET) 50-325-40 MG tablet Take 1-2 tablets by mouth every 6 (six) hours as needed for headache. 03/17/16   Evangeline Dakinharles M Beers, PA-C  levofloxacin (LEVAQUIN) 750 MG tablet Take 1 tablet (750 mg total) by mouth daily. 07/16/16 07/23/16  Laniesha Das L Johnika Escareno, PA-C  predniSONE (DELTASONE)  10 MG tablet Take a daily regimen of 6,5,4,3,2,1 07/16/16   Fredy Gladu L Jeramy Dimmick, PA-C  triamcinolone ointment (KENALOG) 0.5 % Apply 1 application topically 3 (three) times daily. 01/21/16   Tommi Rumpshonda L Summers, PA-C    Allergies Patient has no known allergies.  History reviewed. No pertinent family history.  Social History Social History  Substance Use Topics  . Smoking status: Current Every Day Smoker    Packs/day: 0.50    Types: Cigarettes  . Smokeless tobacco: Former NeurosurgeonUser  . Alcohol use No     Review of Systems  Constitutional: No fever/chills Eyes: No visual changes. No discharge ENT: Positive nasal congestion, runny nose, sinus pressure. No ear pain, sore throat. Cardiovascular: No chest pain. Respiratory: Positive cough, chest congestion, wheezing. No shortness of breath Gastrointestinal: No abdominal pain.  No nausea, vomiting.  No diarrhea.   Musculoskeletal: Positive for back pain. Negative for general myalgias.  Skin: Negative for rash. Neurological: Positive for sinus headaches, but no focal weakness or numbness. 10-point ROS otherwise negative.  ____________________________________________   PHYSICAL EXAM:  VITAL SIGNS: ED Triage Vitals  Enc Vitals Group     BP 07/16/16 1956 122/80     Pulse Rate 07/16/16 1956 75     Resp 07/16/16 1956 18     Temp 07/16/16 1956 98.3 F (36.8 C)     Temp Source 07/16/16 1956 Oral     SpO2 07/16/16 1956 96 %     Weight 07/16/16 1957 135 lb (  61.2 kg)     Height 07/16/16 1957 5\' 8"  (1.727 m)     Head Circumference --      Peak Flow --      Pain Score 07/16/16 2007 6     Pain Loc --      Pain Edu? --      Excl. in GC? --      Constitutional: Alert and oriented. Well appearing and in no acute distress. Eyes: Conjunctivae are normal Without icterus or injection. Head: Atraumatic. ENT:      Ears: Right TM visualized without erythema, bulging, perforation or effusion. Left TM visualized with mild injection, moderate bulging,  trace effusion but no perforation.      Nose: Not congestion with trace clear rhinorrhea.      Mouth/Throat: Mucous membranes are moist. Nares without erythema, swelling, exudate. Airway is patent. Uvula is midline. Clear postnasal drip. Neck: Supple with full range of motion. Hematological/Lymphatic/Immunilogical: No cervical lymphadenopathy. Cardiovascular: Normal rate, regular rhythm. Normal S1 and S2.  Good peripheral circulation. Respiratory: Normal respiratory effort without tachypnea or retractions. Lungs with wheezes noted diffusely in bilateral lung fields but no rhonchi or rales. Breath sounds are heard throughout all lung fields. Musculoskeletal: Full range of motion of bilateral upper and lower studies without pain or difficulty. No tenderness to palpation of the thoracic or lumbar central spinal regions. No muscle spasms noted. Neurologic:  Normal speech and language. No gross focal neurologic deficits are appreciated.  Skin:  Skin is warm, dry and intact. No rash noted. Psychiatric: Mood and affect are normal. Speech and behavior are normal. Patient exhibits appropriate insight and judgement.   ____________________________________________   LABS  None ____________________________________________  EKG  None ____________________________________________  RADIOLOGY I, Hope PigeonJami L Parlee Amescua, personally viewed and evaluated these images (plain radiographs) as part of my medical decision making, as well as reviewing the written report by the radiologist.  Dg Chest 2 View  Result Date: 07/16/2016 CLINICAL DATA:  Cough and wheezing.  Back pain for 2 days. EXAM: CHEST  2 VIEW COMPARISON:  05/26/2016 FINDINGS: Chronic hyperinflation. Biapical pleuroparenchymal scarring and surgical change at the right lung apex. Developing ill-defined opacity in the left lung base concerning for pneumonia. Mild central bronchial thickening. Normal heart size and mediastinal contours. Normal pulmonary  vasculature. No pleural effusion or pneumothorax. IMPRESSION: Patchy left lung base opacity consistent with pneumonia. Followup PA and lateral chest X-ray is recommended in 3-4 weeks following trial of antibiotic therapy to ensure resolution and exclude underlying malignancy. Electronically Signed   By: Rubye OaksMelanie  Ehinger M.D.   On: 07/16/2016 21:24    ____________________________________________    PROCEDURES  Procedure(s) performed: None   Procedures   Medications  ipratropium-albuterol (DUONEB) 0.5-2.5 (3) MG/3ML nebulizer solution 3 mL (3 mLs Nebulization Given 07/16/16 2106)  methylPREDNISolone sodium succinate (SOLU-MEDROL) 125 mg/2 mL injection 125 mg (125 mg Intramuscular Given 07/16/16 2140)  levofloxacin (LEVAQUIN) tablet 750 mg (750 mg Oral Given 07/16/16 2140)     ____________________________________________   INITIAL IMPRESSION / ASSESSMENT AND PLAN / ED COURSE  Pertinent labs & imaging results that were available during my care of the patient were reviewed by me and considered in my medical decision making (see chart for details).  Clinical Course     Patient's diagnosis is consistent with Community-acquired pneumonia of left lower lobe and tobacco use. Patient's respiratory exam was improved after administration of DuoNeb breathing treatment. Patient notes his breathing is better and he feels more open. Patient was given  first dose of Levaquin in the emergency department as well as an IM injection of Solu-Medrol. Patient will be discharged home with prescriptions for Levaquin, prednisone Dosepak and albuterol inhaler to use as directed. Patient is to follow up with [facility] in 3 days if symptoms persist past this treatment course. Patient also understands that he will need to follow up with [facility] where the outpatient urgent care or primary care provider of his choice in 4 weeks for repeat chest x-ray. Patient is given ED precautions to return to  the ED for any worsening or new symptoms.    ____________________________________________  FINAL CLINICAL IMPRESSION(S) / ED DIAGNOSES  Final diagnoses:  Community acquired pneumonia of left lower lobe of lung (HCC)  Tobacco use      NEW MEDICATIONS STARTED DURING THIS VISIT:  New Prescriptions   ALBUTEROL (PROVENTIL HFA;VENTOLIN HFA) 108 (90 BASE) MCG/ACT INHALER    Inhale 1-2 puffs into the lungs every 6 (six) hours as needed for wheezing or shortness of breath.   LEVOFLOXACIN (LEVAQUIN) 750 MG TABLET    Take 1 tablet (750 mg total) by mouth daily.   PREDNISONE (DELTASONE) 10 MG TABLET    Take a daily regimen of 6,5,4,3,2,1         Hope Pigeon, PA-C 07/16/16 2144    Emily Filbert, MD 07/16/16 2244

## 2016-07-20 ENCOUNTER — Encounter: Payer: Self-pay | Admitting: Emergency Medicine

## 2016-07-20 ENCOUNTER — Emergency Department
Admission: EM | Admit: 2016-07-20 | Discharge: 2016-07-20 | Disposition: A | Discharge status: Home / Self Care | Payer: Self-pay | Attending: Emergency Medicine | Admitting: Emergency Medicine

## 2016-07-20 ENCOUNTER — Emergency Department: Payer: Self-pay

## 2016-07-20 DIAGNOSIS — J189 Pneumonia, unspecified organism: Secondary | ICD-10-CM

## 2016-07-20 DIAGNOSIS — F17210 Nicotine dependence, cigarettes, uncomplicated: Secondary | ICD-10-CM | POA: Insufficient documentation

## 2016-07-20 DIAGNOSIS — J181 Lobar pneumonia, unspecified organism: Secondary | ICD-10-CM | POA: Insufficient documentation

## 2016-07-20 LAB — BASIC METABOLIC PANEL
ANION GAP: 9 (ref 5–15)
BUN: 11 mg/dL (ref 6–20)
CALCIUM: 9.3 mg/dL (ref 8.9–10.3)
CO2: 26 mmol/L (ref 22–32)
CREATININE: 0.84 mg/dL (ref 0.61–1.24)
Chloride: 100 mmol/L — ABNORMAL LOW (ref 101–111)
GFR calc Af Amer: >60 mL/min (ref >60)
GFR calc non Af Amer: >60 mL/min (ref >60)
GLUCOSE: 130 mg/dL — AB (ref 65–99)
Potassium: 3.9 mmol/L (ref 3.5–5.1)
Sodium: 135 mmol/L (ref 135–145)

## 2016-07-20 LAB — INFLUENZA PANEL BY PCR (TYPE A & B)
INFLAPCR: NEGATIVE
INFLBPCR: NEGATIVE

## 2016-07-20 LAB — CBC WITH DIFFERENTIAL/PLATELET
BASOS ABS: 0.1 10*3/uL (ref 0–0.1)
BASOS PCT: 1 %
EOS PCT: 1 %
Eosinophils Absolute: 0.1 10*3/uL (ref 0–0.7)
HCT: 48.5 % (ref 40.0–52.0)
Hemoglobin: 16.9 g/dL (ref 13.0–18.0)
LYMPHS PCT: 17 %
Lymphs Abs: 2.6 10*3/uL (ref 1.0–3.6)
MCH: 31.4 pg (ref 26.0–34.0)
MCHC: 34.9 g/dL (ref 32.0–36.0)
MCV: 90 fL (ref 80.0–100.0)
MONO ABS: 1.2 10*3/uL — AB (ref 0.2–1.0)
MONOS PCT: 8 %
Neutro Abs: 11.6 10*3/uL — ABNORMAL HIGH (ref 1.4–6.5)
Neutrophils Relative %: 73 %
PLATELETS: 318 10*3/uL (ref 150–440)
RBC: 5.39 MIL/uL (ref 4.40–5.90)
RDW: 13.2 % (ref 11.5–14.5)
WBC: 15.6 10*3/uL — ABNORMAL HIGH (ref 3.8–10.6)

## 2016-07-20 MED ORDER — IPRATROPIUM-ALBUTEROL 0.5-2.5 (3) MG/3ML IN SOLN
3.0000 mL | Freq: Once | RESPIRATORY_TRACT | Status: AC
  Administered 2016-07-20: 3 mL via RESPIRATORY_TRACT
  Filled 2016-07-20: qty 3

## 2016-07-20 MED ORDER — LEVOFLOXACIN 750 MG PO TABS: 750.0000 mg | ORAL_TABLET | Freq: Every day | ORAL | 0 refills | Status: AC

## 2016-07-20 NOTE — ED Notes (Signed)
Pt states he feels like something is sitting on his chest and c/o chest pain/tightness - pt dx with pneumonia this week and states no relief from atb or inhalers - MD at bedside assessing pt

## 2016-07-20 NOTE — ED Provider Notes (Signed)
Time Seen: Approximately *1157  I have reviewed the triage notes  Chief Complaint: Cough and Chest Pain   History of Present Illness: Walter Morales is a 45 y.o. male who returns to the emergency department after outpatient treatment for community-acquired pneumonia. Patient has been on Levaquin and still has a couple doses left of Levaquin. He denies any fever though has had significant wheezing. His pulse oximetry is 99% on room air. He states lying flat seems to make shortness of breath worse. He denies any significant chest pain to this historian. Today embolism or congestive heart failure. He does have a history of a spontaneous pneumothorax.   Past Medical History:  Diagnosis Date  . Migraine   . Spontaneous pneumothorax     There are no active problems to display for this patient.   Past Surgical History:  Procedure Laterality Date  . ROTATOR CUFF REPAIR      Past Surgical History:  Procedure Laterality Date  . ROTATOR CUFF REPAIR      Current Outpatient Rx  . Order #: [id number] Class: Print  . Order #: [id number] Class: Print  . Order #: [id number] Class: Print  . Order #: [id number] Class: Print  . Order #: [id number] Class: Print  . Order #: [id number] Class: Print    Allergies:  Patient has no known allergies.  Family History: History reviewed. No pertinent family history.  Social History: Social History  Substance Use Topics  . Smoking status: Current Every Day Smoker    Packs/day: 0.50    Types: Cigarettes  . Smokeless tobacco: Former Neurosurgeon  . Alcohol use No     Review of Systems:   10 point review of systems was performed and was otherwise negative:  Constitutional: No fever Eyes: No visual disturbances ENT: No sore throat, ear pain Cardiac: No chest pain Respiratory: Shortness of breath with wheezing. No stridor. Persistent productive cough. No hemoptysis Abdomen: No abdominal pain, no vomiting, No diarrhea Endocrine: No weight loss, No  night sweats Extremities: No peripheral edema, cyanosis Skin: No rashes, easy bruising Neurologic: No focal weakness, trouble with speech or swollowing Urologic: No dysuria, Hematuria, or urinary frequency   Physical Exam:  ED Triage Vitals [07/20/16 1120]  Enc Vitals Group     BP (!) 124/92     Pulse Rate 99     Resp (!) 24     Temp 97.7 F (36.5 C)     Temp Source Oral     SpO2 99 %     Weight      Height      Head Circumference      Peak Flow      Pain Score 9     Pain Loc      Pain Edu?      Excl. in GC?     General: Awake , Alert , and Oriented times 3; GCS 15 Head: Normal cephalic , atraumatic Eyes: Pupils equal , round, reactive to light Nose/Throat: No nasal drainage, patent upper airway without erythema or exudate.  Neck: Supple, Full range of motion, No anterior adenopathy or palpable thyroid masses Lungs: Patient has some wheezing auscultated in all lung fields with some coarse breath sounds bilaterally at the bases no rales or rhonchi are noted  Heart: Regular rate, regular rhythm without murmurs , gallops , or rubs Abdomen: Soft, non tender without rebound, guarding , or rigidity; bowel sounds positive and symmetric in all 4 quadrants. No organomegaly .        Extremities: 2  plus symmetric pulses. No edema, clubbing or cyanosis Neurologic: normal ambulation, Motor symmetric without deficits, sensory intact Skin: warm, dry, no rashes   Labs:   All laboratory work was reviewed including any pertinent negatives or positives listed below:  Labs Reviewed  CBC WITH DIFFERENTIAL/PLATELET - Abnormal; Notable for the following:       Result Value   WBC 15.6 (*)    Neutro Abs 11.6 (*)    Monocytes Absolute 1.2 (*)    All other components within normal limits  BASIC METABOLIC PANEL - Abnormal; Notable for the following:    Chloride 100 (*)    Glucose, Bld 130 (*)    All other components within normal limits  INFLUENZA PANEL BY PCR (TYPE A & B, H1N1)    EKG:   ED ECG REPORT I, Jennye MoccasinBrian S Derotha Fishbaugh, the attending physician, personally viewed and interpreted this ECG.  Date: 07/20/2016 EKG Time: 1122 Rate: 91* Rhythm: normal sinus rhythm QRS Axis: normal Intervals: normal ST/T Wave abnormalities: Nonspecific ST-T wave abnormality Conduction Disturbances: none Narrative Interpretation: unremarkable    Radiology: "Dg Chest 2 View  Result Date: 07/20/2016 CLINICAL DATA:  Initial evaluation for acute chest pain. Recent diagnosis of pneumonia. EXAM: CHEST  2 VIEW COMPARISON:  Prior radiograph from 07/16/2016. FINDINGS: Cardiac and mediastinal silhouettes are stable in size and contour, and remain within normal limits. Lungs are mildly hyper loop plated. Previously seen patchy density at the peripheral left lower lobe is improved, with only minimal residual opacity now evident. Elevation of the right minor fissure is stable. No new focal infiltrates. No pulmonary edema or pleural effusion. No pneumothorax. Suture material noted at the right lung apex, stable. No acute osseous abnormality. IMPRESSION: 1. Interval improvement in previously identified patchy left lower lobe opacity as compared to 07/16/16, suggesting improved infiltrate/pneumonia. 2. No other active cardiopulmonary disease identified. Electronically Signed   By: Rise MuBenjamin  McClintock M.D.   On: 07/20/2016 12:34   Dg Chest 2 View  Result Date: 07/16/2016 CLINICAL DATA:  Cough and wheezing.  Back pain for 2 days. EXAM: CHEST  2 VIEW COMPARISON:  05/26/2016 FINDINGS: Chronic hyperinflation. Biapical pleuroparenchymal scarring and surgical change at the right lung apex. Developing ill-defined opacity in the left lung base concerning for pneumonia. Mild central bronchial thickening. Normal heart size and mediastinal contours. Normal pulmonary vasculature. No pleural effusion or pneumothorax. IMPRESSION: Patchy left lung base opacity consistent with pneumonia. Followup PA and lateral chest X-ray is  recommended in 3-4 weeks following trial of antibiotic therapy to ensure resolution and exclude underlying malignancy. Electronically Signed   By: Rubye OaksMelanie  Ehinger M.D.   On: 07/16/2016 21:24  " Chest x-ray repeat seemed to show some improvement of the patient's left lower lobe pneumonia I personally reviewed the radiologic studies    ED Course:  Patient's stay here was uneventful and appears that he has some resolving pneumonia with bronchospasm. Patient otherwise does not appear to be hypoxic nor septic at this time. I extended his course of Levaquin and he states he has enough inhalers at home and he was advised take stellate the usage of his inhaler if he feels short of breath. Clinical Course      Assessment:  Acute community-acquired pneumonia   Final Clinical Impression:   Final diagnoses:  Community acquired pneumonia of left lower lobe of lung (HCC)     Plan: * Outpatient Patient was advised to return immediately if condition worsens. Patient was advised to follow up with their primary care  physician or other specialized physicians involved in their outpatient care. The patient and/or family member/power of attorney had laboratory results reviewed at the bedside. All questions and concerns were addressed and appropriate discharge instructions were distributed by the nursing staff.             Jennye MoccasinBrian S Sandon Yoho, MD 07/20/16 [phone]

## 2016-07-20 NOTE — Discharge Instructions (Signed)
Please return immediately if condition worsens. Please contact her primary physician or the physician you were given for referral. If you have any specialist physicians involved in her treatment and plan please also contact them. Thank you for using [facility]. ° °

## 2016-07-20 NOTE — ED Triage Notes (Addendum)
Pt to ed with c/o chest pain that started on Friday night, was seen here dx with pneumonia.  Pt with audible wheezing at triage.  Appears in mild resp distress at triage, pt with heavy work of breathing  RR 24  Pt states laying flat makes sob worse.  sats 99%RA

## 2016-12-15 ENCOUNTER — Emergency Department: Payer: Self-pay

## 2016-12-15 ENCOUNTER — Encounter: Payer: Self-pay | Admitting: *Deleted

## 2016-12-15 ENCOUNTER — Emergency Department
Admission: EM | Admit: 2016-12-15 | Discharge: 2016-12-15 | Disposition: A | Discharge status: Home / Self Care | Payer: Self-pay | Attending: Emergency Medicine | Admitting: Emergency Medicine

## 2016-12-15 DIAGNOSIS — F17210 Nicotine dependence, cigarettes, uncomplicated: Secondary | ICD-10-CM | POA: Insufficient documentation

## 2016-12-15 DIAGNOSIS — R1031 Right lower quadrant pain: Secondary | ICD-10-CM | POA: Insufficient documentation

## 2016-12-15 DIAGNOSIS — J45909 Unspecified asthma, uncomplicated: Secondary | ICD-10-CM | POA: Insufficient documentation

## 2016-12-15 DIAGNOSIS — J189 Pneumonia, unspecified organism: Secondary | ICD-10-CM | POA: Insufficient documentation

## 2016-12-15 DIAGNOSIS — Z79899 Other long term (current) drug therapy: Secondary | ICD-10-CM | POA: Insufficient documentation

## 2016-12-15 HISTORY — DX: Unspecified asthma, uncomplicated: J45.909

## 2016-12-15 LAB — CBC
HCT: 47.5 % (ref 40.0–52.0)
Hemoglobin: 16.5 g/dL (ref 13.0–18.0)
MCH: 31.1 pg (ref 26.0–34.0)
MCHC: 34.8 g/dL (ref 32.0–36.0)
MCV: 89.2 fL (ref 80.0–100.0)
PLATELETS: 301 10*3/uL (ref 150–440)
RBC: 5.32 MIL/uL (ref 4.40–5.90)
RDW: 12.9 % (ref 11.5–14.5)
WBC: 12.8 10*3/uL — AB (ref 3.8–10.6)

## 2016-12-15 LAB — COMPREHENSIVE METABOLIC PANEL
ALT: 18 U/L (ref 17–63)
AST: 35 U/L (ref 15–41)
Albumin: 3.8 g/dL (ref 3.5–5.0)
Alkaline Phosphatase: 82 U/L (ref 38–126)
Anion gap: 10 (ref 5–15)
BILIRUBIN TOTAL: 0.7 mg/dL (ref 0.3–1.2)
BUN: 12 mg/dL (ref 6–20)
CALCIUM: 9.5 mg/dL (ref 8.9–10.3)
CO2: 25 mmol/L (ref 22–32)
Chloride: 102 mmol/L (ref 101–111)
Creatinine, Ser: 0.74 mg/dL (ref 0.61–1.24)
Glucose, Bld: 145 mg/dL — ABNORMAL HIGH (ref 65–99)
Potassium: 3.2 mmol/L — ABNORMAL LOW (ref 3.5–5.1)
Sodium: 137 mmol/L (ref 135–145)
TOTAL PROTEIN: 8.3 g/dL — AB (ref 6.5–8.1)

## 2016-12-15 LAB — URINALYSIS, COMPLETE (UACMP) WITH MICROSCOPIC
Bacteria, UA: NONE SEEN
Bilirubin Urine: NEGATIVE
Glucose, UA: NEGATIVE mg/dL
Hgb urine dipstick: NEGATIVE
KETONES UR: 5 mg/dL — AB
Leukocytes, UA: NEGATIVE
Nitrite: NEGATIVE
PH: 5 (ref 5.0–8.0)
Protein, ur: NEGATIVE mg/dL
Specific Gravity, Urine: 1.027 (ref 1.005–1.030)

## 2016-12-15 LAB — TYPE AND SCREEN
ABO/RH(D): O POS
Antibody Screen: NEGATIVE

## 2016-12-15 LAB — LIPASE, BLOOD: LIPASE: 19 U/L (ref 11–51)

## 2016-12-15 MED ORDER — LEVOFLOXACIN 500 MG PO TABS: 500.0000 mg | ORAL_TABLET | Freq: Every day | ORAL | 0 refills | Status: AC

## 2016-12-15 MED ORDER — MORPHINE SULFATE (PF) 2 MG/ML IV SOLN
INTRAVENOUS | Status: AC
  Administered 2016-12-15: 2 mg via INTRAVENOUS
  Filled 2016-12-15: qty 1

## 2016-12-15 MED ORDER — ONDANSETRON HCL 4 MG/2ML IJ SOLN
4.0000 mg | Freq: Once | INTRAMUSCULAR | Status: AC
  Administered 2016-12-15: 4 mg via INTRAVENOUS

## 2016-12-15 MED ORDER — IOPAMIDOL (ISOVUE-300) INJECTION 61%
100.0000 mL | Freq: Once | INTRAVENOUS | Status: AC | PRN
  Administered 2016-12-15: 100 mL via INTRAVENOUS
  Filled 2016-12-15: qty 100

## 2016-12-15 MED ORDER — ONDANSETRON HCL 4 MG/2ML IJ SOLN
INTRAMUSCULAR | Status: AC
  Administered 2016-12-15: 4 mg via INTRAVENOUS
  Filled 2016-12-15: qty 2

## 2016-12-15 MED ORDER — MORPHINE SULFATE (PF) 2 MG/ML IV SOLN
2.0000 mg | Freq: Once | INTRAVENOUS | Status: AC
  Administered 2016-12-15: 2 mg via INTRAVENOUS

## 2016-12-15 MED ORDER — IOPAMIDOL (ISOVUE-300) INJECTION 61%
30.0000 mL | Freq: Once | INTRAVENOUS | Status: AC | PRN
  Administered 2016-12-15: 30 mL via ORAL
  Filled 2016-12-15: qty 30

## 2016-12-15 NOTE — ED Notes (Signed)
ED Provider at bedside. 

## 2016-12-15 NOTE — ED Triage Notes (Signed)
C/O RLQ pain and diarrhea x 1 week.  Symptoms worsening and last night patient reports rectal bleeding.  Also c/o chills.

## 2016-12-15 NOTE — ED Notes (Signed)
Patient transported to CT 

## 2016-12-15 NOTE — ED Notes (Signed)
Pt finished contrast, CT called.

## 2016-12-15 NOTE — ED Provider Notes (Signed)
[facility] Emergency Department Provider Note   ____________________________________________    I have reviewed the triage vital signs and the nursing notes.   HISTORY  Chief Complaint Abdominal Pain     HPI Walter Morales is a 46 y.o. male who p/w rlq abd pain. He notes the pain is aching and moderate in nature. Pain started last night. Initially developed diarrhea 3 days ago and the pain began last night. No fevers/chills. No hx of abdominal surgery. Has not taken anything for the pain. No recent travel. No sick contacts. No cough or sob. No pleurisy   Past Medical History:  Diagnosis Date  . Asthma   . Migraine   . Spontaneous pneumothorax     There are no active problems to display for this patient.   Past Surgical History:  Procedure Laterality Date  . ROTATOR CUFF REPAIR      Prior to Admission medications   Medication Sig Start Date End Date Taking? Authorizing Provider  albuterol (PROVENTIL HFA;VENTOLIN HFA) 108 (90 Base) MCG/ACT inhaler Inhale 1-2 puffs into the lungs every 6 (six) hours as needed for wheezing or shortness of breath. 07/16/16   Hagler, Jami L, PA-C  butalbital-acetaminophen-caffeine (FIORICET) 50-325-40 MG tablet Take 1-2 tablets by mouth every 6 (six) hours as needed for headache. 03/17/16   Beers, Charmayne Sheer, PA-C  levofloxacin (LEVAQUIN) 500 MG tablet Take 1 tablet (500 mg total) by mouth daily. 12/15/16 12/25/16  Jene Every, MD  predniSONE (DELTASONE) 10 MG tablet Take a daily regimen of 6,5,4,3,2,1 07/16/16   Hagler, Jami L, PA-C  triamcinolone ointment (KENALOG) 0.5 % Apply 1 application topically 3 (three) times daily. 01/21/16   Tommi Rumps, PA-C     Allergies Patient has no known allergies.  No family history on file.  Social History Social History  Substance Use Topics  . Smoking status: Current Every Day Smoker    Packs/day: 0.50    Types: Cigarettes  . Smokeless tobacco: Former Neurosurgeon  .  Alcohol use No    Review of Systems  Constitutional: No fever/chills Eyes: No visual changes.  ENT: No sore throat. Cardiovascular: Denies chest pain. Respiratory: Denies shortness of breath. No cough Gastrointestinal: as above, no nausea Genitourinary: Negative for dysuria. Musculoskeletal: Negative for back pain. Skin: Negative for rash. Neurological: Negative for headaches    ____________________________________________   PHYSICAL EXAM:  VITAL SIGNS: ED Triage Vitals  Enc Vitals Group     BP 12/15/16 1124 137/74     Pulse Rate 12/15/16 1124 92     Resp 12/15/16 1124 16     Temp 12/15/16 1124 98.8 F (37.1 C)     Temp Source 12/15/16 1124 Oral     SpO2 12/15/16 1124 99 %     Weight 12/15/16 1125 145 lb (65.8 kg)     Height 12/15/16 1125 5\' 8"  (1.727 m)     Head Circumference --      Peak Flow --      Pain Score 12/15/16 1124 10     Pain Loc --      Pain Edu? --      Excl. in GC? --     Constitutional: Alert and oriented. No acute distress. Pleasant and interactive Eyes: Conjunctivae are normal.   Nose: No congestion/rhinnorhea. Mouth/Throat: Mucous membranes are moist.    Cardiovascular: Normal rate, regular rhythm. Grossly normal heart sounds.  Good peripheral circulation. Respiratory: Normal respiratory effort.  No retractions.  Gastrointestinal: Mild ttp  in the RLQ. No distention.  No CVA tenderness. Genitourinary: deferred Musculoskeletal: No lower extremity tenderness nor edema.  Warm and well perfused Neurologic:  Normal speech and language. No gross focal neurologic deficits are appreciated.  Skin:  Skin is warm, dry and intact. No rash noted. Psychiatric: Mood and affect are normal. Speech and behavior are normal.  ____________________________________________   LABS (all labs ordered are listed, but only abnormal results are displayed)  Labs Reviewed  COMPREHENSIVE METABOLIC PANEL - Abnormal; Notable for the following:       Result Value    Potassium 3.2 (*)    Glucose, Bld 145 (*)    Total Protein 8.3 (*)    All other components within normal limits  CBC - Abnormal; Notable for the following:    WBC 12.8 (*)    All other components within normal limits  URINALYSIS, COMPLETE (UACMP) WITH MICROSCOPIC - Abnormal; Notable for the following:    Color, Urine AMBER (*)    APPearance HAZY (*)    Ketones, ur 5 (*)    Squamous Epithelial / LPF 0-5 (*)    All other components within normal limits  LIPASE, BLOOD  POC OCCULT BLOOD, ED  TYPE AND SCREEN   ____________________________________________  EKG  none ____________________________________________  RADIOLOGY  CT abd pelv: normal appendix, likely PNA ____________________________________________   PROCEDURES  Procedure(s) performed: No    Critical Care performed: No ____________________________________________   INITIAL IMPRESSION / ASSESSMENT AND PLAN / ED COURSE  Pertinent labs & imaging results that were available during my care of the patient were reviewed by me and considered in my medical decision making (see chart for details).  Patient well appearing and in no acute distress. Mild ttp in the rlq. Mildly elevated wbc. 3 days of diarrhea..low risk of appendicitis but given ttp and wbc we will image. IV morphine and zofran given  CT shows normal appendix however concern for PNA. This is unusual as he is asymptomatic and VERY well appearing. No sob, no cough. No fevers. However this may be the cause of his pain, I will tx with levaquin as he does not require inpatient treatment at this time.  Strict return precautions. Patient agrees with plan    ____________________________________________   FINAL CLINICAL IMPRESSION(S) / ED DIAGNOSES  Final diagnoses:  Right lower quadrant abdominal pain  Community acquired pneumonia, unspecified laterality      NEW MEDICATIONS STARTED DURING THIS VISIT:  Discharge Medication List as of 12/15/2016  2:43 PM     START taking these medications   Details  levofloxacin (LEVAQUIN) 500 MG tablet Take 1 tablet (500 mg total) by mouth daily., Starting Wed 12/15/2016, Until Sat 12/25/2016, Print         Note:  This document was prepared using Dragon voice recognition software and may include unintentional dictation errors.    Jene EveryKinner, Demarlo Riojas, MD 12/15/16 [phone]

## 2016-12-15 NOTE — ED Notes (Signed)
Pt back from CT

## 2017-02-07 ENCOUNTER — Encounter: Payer: Self-pay | Admitting: Emergency Medicine

## 2017-02-07 ENCOUNTER — Emergency Department
Admission: EM | Admit: 2017-02-07 | Discharge: 2017-02-07 | Disposition: A | Discharge status: Home / Self Care | Payer: Self-pay | Attending: Emergency Medicine | Admitting: Emergency Medicine

## 2017-02-07 DIAGNOSIS — R21 Rash and other nonspecific skin eruption: Secondary | ICD-10-CM | POA: Insufficient documentation

## 2017-02-07 DIAGNOSIS — J45909 Unspecified asthma, uncomplicated: Secondary | ICD-10-CM | POA: Insufficient documentation

## 2017-02-07 DIAGNOSIS — Z7952 Long term (current) use of systemic steroids: Secondary | ICD-10-CM | POA: Insufficient documentation

## 2017-02-07 DIAGNOSIS — Z7951 Long term (current) use of inhaled steroids: Secondary | ICD-10-CM | POA: Insufficient documentation

## 2017-02-07 DIAGNOSIS — F17210 Nicotine dependence, cigarettes, uncomplicated: Secondary | ICD-10-CM | POA: Insufficient documentation

## 2017-02-07 MED ORDER — FAMOTIDINE 20 MG PO TABS
20.0000 mg | ORAL_TABLET | Freq: Once | ORAL | Status: AC
  Administered 2017-02-07: 20 mg via ORAL
  Filled 2017-02-07: qty 1

## 2017-02-07 MED ORDER — DIPHENHYDRAMINE HCL 25 MG PO CAPS: 25.0000 mg | ORAL_CAPSULE | ORAL | 0 refills | Status: DC | PRN

## 2017-02-07 MED ORDER — METHYLPREDNISOLONE SODIUM SUCC 125 MG IJ SOLR
125.0000 mg | Freq: Once | INTRAMUSCULAR | Status: AC
  Administered 2017-02-07: 125 mg via INTRAMUSCULAR
  Filled 2017-02-07: qty 2

## 2017-02-07 MED ORDER — DIPHENHYDRAMINE HCL 50 MG/ML IJ SOLN
25.0000 mg | Freq: Once | INTRAMUSCULAR | Status: AC
  Administered 2017-02-07: 25 mg via INTRAMUSCULAR
  Filled 2017-02-07: qty 1

## 2017-02-07 MED ORDER — PREDNISONE 10 MG PO TABS: 40.0000 mg | ORAL_TABLET | Freq: Every day | ORAL | 0 refills | Status: AC

## 2017-02-07 MED ORDER — RANITIDINE HCL 150 MG PO TABS: 150.0000 mg | ORAL_TABLET | Freq: Two times a day (BID) | ORAL | 0 refills | Status: DC

## 2017-02-07 NOTE — ED Notes (Signed)
See triage note  States he noticed a small area to left upper back couple of days ago.  Then woke up with areas to left lateral rib area and back  And into groin area  Positive itching

## 2017-02-07 NOTE — ED Provider Notes (Signed)
[facility] Emergency Department Provider Note  ____________________________________________  Time seen: Approximately 10:04 AM  I have reviewed the triage vital signs and the nursing notes.   HISTORY  Chief Complaint Rash    HPI Walter Morales is a 46 y.o. male that presents to the emergency department with rash for 1 day. Rash started over his left thorax and last night began spreading to his groin. It itches and burns. They start as a white head and then go pink. He tried Shingrex and aspirin, which has not helped. He went to a BBQ over the weekend. No exposure to posion ivy and posion oak. He sleeps in the same bed as his wife, who doesn't have similar symptoms. Only tick bite was a month ago. He has had chicken pox in the past. No STI risk. No fever, SOB, CP, nausea, vomiting, abdominal pain.    Past Medical History:  Diagnosis Date  . Asthma   . Migraine   . Spontaneous pneumothorax     There are no active problems to display for this patient.   Past Surgical History:  Procedure Laterality Date  . ROTATOR CUFF REPAIR      Prior to Admission medications   Medication Sig Start Date End Date Taking? Authorizing Provider  albuterol (PROVENTIL HFA;VENTOLIN HFA) 108 (90 Base) MCG/ACT inhaler Inhale 1-2 puffs into the lungs every 6 (six) hours as needed for wheezing or shortness of breath. 07/16/16   Hagler, Jami L, PA-C  butalbital-acetaminophen-caffeine (FIORICET) 50-325-40 MG tablet Take 1-2 tablets by mouth every 6 (six) hours as needed for headache. 03/17/16   Beers, Charmayne Sheerharles M, PA-C  diphenhydrAMINE (BENADRYL) 25 mg capsule Take 1 capsule (25 mg total) by mouth every 4 (four) hours as needed. 02/07/17 02/07/18  Enid DerryWagner, Ayleen Mckinstry, PA-C  predniSONE (DELTASONE) 10 MG tablet Take 4 tablets (40 mg total) by mouth daily. 02/07/17 02/12/17  Enid DerryWagner, Seymour Pavlak, PA-C  ranitidine (ZANTAC) 150 MG tablet Take 1 tablet (150 mg total) by mouth 2 (two) times daily. 02/07/17 02/07/18   Enid DerryWagner, Reyann Troop, PA-C  triamcinolone ointment (KENALOG) 0.5 % Apply 1 application topically 3 (three) times daily. 01/21/16   Tommi RumpsSummers, Rhonda L, PA-C    Allergies Patient has no known allergies.  History reviewed. No pertinent family history.  Social History Social History  Substance Use Topics  . Smoking status: Current Every Day Smoker    Packs/day: 0.50    Types: Cigarettes  . Smokeless tobacco: Former NeurosurgeonUser  . Alcohol use No     Review of Systems  Constitutional: No fever/chills Cardiovascular: No chest pain. Respiratory: No SOB. Gastrointestinal: No abdominal pain.  No nausea, no vomiting.  Musculoskeletal: Negative for musculoskeletal pain. Skin: Negative for abrasions, lacerations, ecchymosis. Positive for rash.  Neurological: Negative for numbness or tingling   ____________________________________________   PHYSICAL EXAM:  VITAL SIGNS: ED Triage Vitals [02/07/17 0903]  Enc Vitals Group     BP 133/86     Pulse Rate 77     Resp 18     Temp 98 F (36.7 C)     Temp Source Oral     SpO2 98 %     Weight 135 lb (61.2 kg)     Height 5\' 8"  (1.727 m)     Head Circumference      Peak Flow      Pain Score 8     Pain Loc      Pain Edu?      Excl. in GC?  Constitutional: Alert and oriented. Well appearing and in no acute distress. Eyes: Conjunctivae are normal. PERRL. EOMI. Head: Atraumatic. ENT:      Ears:      Nose: No congestion/rhinnorhea.      Mouth/Throat: Mucous membranes are moist.  Neck: No stridor.   Cardiovascular: Normal rate, regular rhythm.  Good peripheral circulation. Respiratory: Normal respiratory effort without tachypnea or retractions. Lungs CTAB. Good air entry to the bases with no decreased or absent breath sounds. Gastrointestinal: Bowel sounds 4 quadrants. Soft and nontender to palpation. No guarding or rigidity. No palpable masses. No distention. Musculoskeletal: Full range of motion to all extremities. No gross deformities  appreciated. Neurologic:  Normal speech and language. No gross focal neurologic deficits are appreciated.  Skin:  Skin is warm, dry. Wheals over neck, upper right back, left thorax into groin.We are upper back has a central 1mm puncture. No crusting. No vesicles.    ____________________________________________   LABS (all labs ordered are listed, but only abnormal results are displayed)  Labs Reviewed - No data to display ____________________________________________  EKG   ____________________________________________  RADIOLOGY  No results found.  ____________________________________________    PROCEDURES  Procedure(s) performed:    Procedures    Medications  methylPREDNISolone sodium succinate (SOLU-MEDROL) 125 mg/2 mL injection 125 mg (125 mg Intramuscular Given 02/07/17 1111)  diphenhydrAMINE (BENADRYL) injection 25 mg (25 mg Intramuscular Given 02/07/17 1112)  famotidine (PEPCID) tablet 20 mg (20 mg Oral Given 02/07/17 1111)     ____________________________________________   INITIAL IMPRESSION / ASSESSMENT AND PLAN / ED COURSE  Pertinent labs & imaging results that were available during my care of the patient were reviewed by me and considered in my medical decision making (see chart for details).  Review of the [place] CSRS was performed in accordance of the [facility] prior to dispensing any controlled drugs.   Patient presented to emergency department with rash for 1 day. Vital signs and exam are reassuring. Rash looks like an allergic reaction to a possible insect bite. Symptoms and appearance is not consistent with shingles. Patient was given Solu-Medrol, Benadryl, Pepcid. Patient will be discharged home with prescriptions for Benadryl and prednisone. Patient is to follow up with PCP as directed. Patient is given ED precautions to return to the ED for any worsening or new symptoms.     ____________________________________________  FINAL CLINICAL IMPRESSION(S) / ED  DIAGNOSES  Final diagnoses:  Rash and nonspecific skin eruption      NEW MEDICATIONS STARTED DURING THIS VISIT:  Discharge Medication List as of 02/07/2017 11:44 AM    START taking these medications   Details  diphenhydrAMINE (BENADRYL) 25 mg capsule Take 1 capsule (25 mg total) by mouth every 4 (four) hours as needed., Starting Mon 02/07/2017, Until Tue 02/07/2018, Print    ranitidine (ZANTAC) 150 MG tablet Take 1 tablet (150 mg total) by mouth 2 (two) times daily., Starting Mon 02/07/2017, Until Tue 02/07/2018, Print            This chart was dictated using voice recognition software/Dragon. Despite best efforts to proofread, errors can occur which can change the meaning. Any change was purely unintentional.    Enid Derry, PA-C 02/08/17 1130    Merrily Brittle, MD 02/09/17 [phone]

## 2017-02-07 NOTE — ED Triage Notes (Signed)
Pt to ed with c/o rash that started yesterday, burning, itching to left side of body.

## 2018-03-02 ENCOUNTER — Other Ambulatory Visit: Payer: Self-pay

## 2018-03-02 ENCOUNTER — Encounter: Payer: Self-pay | Admitting: Emergency Medicine

## 2018-03-02 ENCOUNTER — Emergency Department
Admission: EM | Admit: 2018-03-02 | Discharge: 2018-03-02 | Disposition: A | Discharge status: Home / Self Care | Payer: Self-pay | Attending: Emergency Medicine | Admitting: Emergency Medicine

## 2018-03-02 DIAGNOSIS — W228XXA Striking against or struck by other objects, initial encounter: Secondary | ICD-10-CM | POA: Insufficient documentation

## 2018-03-02 DIAGNOSIS — Y92019 Unspecified place in single-family (private) house as the place of occurrence of the external cause: Secondary | ICD-10-CM | POA: Insufficient documentation

## 2018-03-02 DIAGNOSIS — S0501XA Injury of conjunctiva and corneal abrasion without foreign body, right eye, initial encounter: Secondary | ICD-10-CM | POA: Insufficient documentation

## 2018-03-02 DIAGNOSIS — Y999 Unspecified external cause status: Secondary | ICD-10-CM | POA: Insufficient documentation

## 2018-03-02 DIAGNOSIS — Y939 Activity, unspecified: Secondary | ICD-10-CM | POA: Insufficient documentation

## 2018-03-02 MED ORDER — OLOPATADINE HCL 0.2 % OP SOLN: 1.0000 [drp] | Freq: Every morning | OPHTHALMIC | 0 refills | Status: DC

## 2018-03-02 MED ORDER — TETRACAINE HCL 0.5 % OP SOLN
OPHTHALMIC | Status: AC
  Filled 2018-03-02: qty 4

## 2018-03-02 MED ORDER — FLUORESCEIN SODIUM 1 MG OP STRP
ORAL_STRIP | OPHTHALMIC | Status: AC
  Filled 2018-03-02: qty 1

## 2018-03-02 MED ORDER — IBUPROFEN 600 MG PO TABS
600.0000 mg | ORAL_TABLET | Freq: Once | ORAL | Status: AC
  Administered 2018-03-02: 600 mg via ORAL
  Filled 2018-03-02: qty 1

## 2018-03-02 MED ORDER — EYE WASH OPHTH SOLN
OPHTHALMIC | Status: AC
  Filled 2018-03-02: qty 118

## 2018-03-02 MED ORDER — GENTAMICIN SULFATE 0.3 % OP SOLN: 1.0000 [drp] | OPHTHALMIC | 0 refills | Status: DC

## 2018-03-02 MED ORDER — OXYCODONE-ACETAMINOPHEN 5-325 MG PO TABS
1.0000 | ORAL_TABLET | Freq: Once | ORAL | Status: AC
  Administered 2018-03-02: 1 via ORAL
  Filled 2018-03-02: qty 1

## 2018-03-02 NOTE — ED Triage Notes (Signed)
First Nurse Note:  Hit with a piece of bark to right eye.  Patient was inside his home and the piece of bark hit the window from a neighbor mowing, and bark hit eye.  Arrives via [facility].

## 2018-03-02 NOTE — ED Triage Notes (Signed)
Was standing in home when a piece of bark was kicked from Wm[employer]neighbor's lawn mower, hit and broke window and either a piece of glass or a piece of bark hit right eye.  C/O pain to right eye and developing headache from the pain.  Patient is AAOx3.  Skin warm and dry.  Sclera clear bilaterally to eyes.  C/o blurriness to right eye vision.

## 2018-03-02 NOTE — Discharge Instructions (Signed)
Follow discharge care instruction.  Use eyedrops as directed.  Advised sunglasses when outdoors.  Follow-up with ophthalmology if no improvement in 2 to 3 days.

## 2018-03-02 NOTE — ED Provider Notes (Signed)
[facility] Emergency Department Provider Note   ____________________________________________   First MD Initiated Contact with Patient 03/02/18 1043     (approximate)  I have reviewed the triage vital signs and the nursing notes.   HISTORY  Chief Complaint Eye Injury    HPI Walter Morales is a 47 y.o. male right eye pain secondary to foreign body prior to arrival.  Patient suspect bark or broken glass struck right eye while neighbor was mowing grass.  Patient state decreased visual acuity, right eye pain, and headache.  Past Medical History:  Diagnosis Date  . Asthma   . Migraine   . Spontaneous pneumothorax     There are no active problems to display for this patient.   Past Surgical History:  Procedure Laterality Date  . ROTATOR CUFF REPAIR      Prior to Admission medications   Medication Sig Start Date End Date Taking? Authorizing Provider  albuterol (PROVENTIL HFA;VENTOLIN HFA) 108 (90 Base) MCG/ACT inhaler Inhale 1-2 puffs into the lungs every 6 (six) hours as needed for wheezing or shortness of breath. 07/16/16   Hagler, Jami L, PA-C  butalbital-acetaminophen-caffeine (FIORICET) 50-325-40 MG tablet Take 1-2 tablets by mouth every 6 (six) hours as needed for headache. 03/17/16   Beers, Charmayne Sheer, PA-C  diphenhydrAMINE (BENADRYL) 25 mg capsule Take 1 capsule (25 mg total) by mouth every 4 (four) hours as needed. 02/07/17 02/07/18  Enid Derry, PA-C  gentamicin (GARAMYCIN) 0.3 % ophthalmic solution Place 1 drop into the right eye every 4 (four) hours. 03/02/18   Joni Reining, PA-C  Olopatadine HCl 0.2 % SOLN Apply 1 drop to eye every morning. 03/02/18   Joni Reining, PA-C  ranitidine (ZANTAC) 150 MG tablet Take 1 tablet (150 mg total) by mouth 2 (two) times daily. 02/07/17 02/07/18  Enid Derry, PA-C  triamcinolone ointment (KENALOG) 0.5 % Apply 1 application topically 3 (three) times daily. 01/21/16   Tommi Rumps, PA-C     Allergies Patient has no known allergies.  No family history on file.  Social History Social History   Tobacco Use  . Smoking status: Current Every Day Smoker    Packs/day: 0.50    Types: Cigarettes  . Smokeless tobacco: Former Engineer, water Use Topics  . Alcohol use: No  . Drug use: No    Review of Systems Constitutional: No fever/chills Eyes: Decreased vision right eye.  Foreign body sensation. ENT: No sore throat. Cardiovascular: Denies chest pain. Respiratory: Denies shortness of breath. Gastrointestinal: No abdominal pain.  No nausea, no vomiting.  No diarrhea.  No constipation. Genitourinary: Negative for dysuria. Musculoskeletal: Negative for back pain. Skin: Negative for rash. Neurological: Positive for headaches, but denies focal weakness or numbness.   ____________________________________________   PHYSICAL EXAM:  VITAL SIGNS: ED Triage Vitals  Enc Vitals Group     BP 03/02/18 1043 132/80     Pulse Rate 03/02/18 1043 65     Resp --      Temp 03/02/18 1043 98.2 F (36.8 C)     Temp Source 03/02/18 1043 Oral     SpO2 03/02/18 1043 98 %     Weight 03/02/18 1038 130 lb (59 kg)     Height 03/02/18 1038 5\' 7"  (1.702 m)     Head Circumference --      Peak Flow --      Pain Score 03/02/18 1038 7     Pain Loc --  Pain Edu? --      Excl. in GC? --    Constitutional: Alert and oriented. Well appearing and in no acute distress. Eyes: See visual acuity in chart.  Conjunctivae are normal.  Photophobic.  Staining of eye revealed corneal abrasion without foreign body.   Cardiovascular: Normal rate, regular rhythm. Grossly normal heart sounds.  Good peripheral circulation. Respiratory: Normal respiratory effort.  No retractions. Lungs CTAB. Skin:  Skin is warm, dry and intact. No rash noted. Psychiatric: Mood and affect are normal. Speech and behavior are normal.  ____________________________________________   LABS (all labs ordered are listed,  but only abnormal results are displayed)  Labs Reviewed - No data to display ____________________________________________  EKG   ____________________________________________  RADIOLOGY  ED MD interpretation:    Official radiology report(s): No results found.  ____________________________________________   PROCEDURES  Procedure(s) performed: None  Procedures  Critical Care performed: No  ____________________________________________   INITIAL IMPRESSION / ASSESSMENT AND PLAN / ED COURSE  As part of my medical decision making, I reviewed the following data within the electronic MEDICAL RECORD NUMBER    Right eye pain secondary corneal abrasion.  Patient given discharge care instructions.  Patient advised use eyedrops as directed.  Patient advised to follow-up ophthalmology if no improvement in 2 days.      ____________________________________________   FINAL CLINICAL IMPRESSION(S) / ED DIAGNOSES  Final diagnoses:  Corneal abrasion, right, initial encounter     ED Discharge Orders        Ordered    gentamicin (GARAMYCIN) 0.3 % ophthalmic solution  Every 4 hours     03/02/18 1103    Olopatadine HCl 0.2 % SOLN   Every morning - 10a     03/02/18 1103       Note:  This document was prepared using Dragon voice recognition software and may include unintentional dictation errors.    Joni ReiningSmith, Ronald K, PA-C 03/02/18 1109    Sharman CheekStafford, Phillip, MD 03/06/18 Perlie [id number]

## 2018-03-02 NOTE — ED Notes (Signed)
Visual acuity Left 20/20 Right 20/100  Hit with a piece of wood that was projected from a lawnmower and came through his window and hit  Him in right eye.

## 2018-09-13 ENCOUNTER — Emergency Department: Payer: Self-pay

## 2018-09-13 ENCOUNTER — Encounter: Payer: Self-pay | Admitting: Emergency Medicine

## 2018-09-13 ENCOUNTER — Other Ambulatory Visit: Payer: Self-pay

## 2018-09-13 ENCOUNTER — Emergency Department
Admission: EM | Admit: 2018-09-13 | Discharge: 2018-09-13 | Disposition: A | Discharge status: Home / Self Care | Payer: Self-pay | Attending: Emergency Medicine | Admitting: Emergency Medicine

## 2018-09-13 DIAGNOSIS — Z79899 Other long term (current) drug therapy: Secondary | ICD-10-CM | POA: Insufficient documentation

## 2018-09-13 DIAGNOSIS — F17210 Nicotine dependence, cigarettes, uncomplicated: Secondary | ICD-10-CM | POA: Insufficient documentation

## 2018-09-13 DIAGNOSIS — R0602 Shortness of breath: Secondary | ICD-10-CM | POA: Insufficient documentation

## 2018-09-13 DIAGNOSIS — R079 Chest pain, unspecified: Secondary | ICD-10-CM

## 2018-09-13 DIAGNOSIS — J40 Bronchitis, not specified as acute or chronic: Secondary | ICD-10-CM

## 2018-09-13 LAB — LIPASE, BLOOD: Lipase: 24 U/L (ref 11–51)

## 2018-09-13 LAB — HEPATIC FUNCTION PANEL
ALBUMIN: 3.8 g/dL (ref 3.5–5.0)
ALK PHOS: 67 U/L (ref 38–126)
ALT: 9 U/L (ref 0–44)
AST: 16 U/L (ref 15–41)
BILIRUBIN TOTAL: 0.3 mg/dL (ref 0.3–1.2)
Bilirubin, Direct: <0.1 mg/dL (ref 0.0–0.2)
Total Protein: 6.5 g/dL (ref 6.5–8.1)

## 2018-09-13 LAB — BASIC METABOLIC PANEL
Anion gap: 7 (ref 5–15)
BUN: 14 mg/dL (ref 6–20)
CALCIUM: 8.7 mg/dL — AB (ref 8.9–10.3)
CO2: 23 mmol/L (ref 22–32)
CREATININE: 0.58 mg/dL — AB (ref 0.61–1.24)
Chloride: 106 mmol/L (ref 98–111)
GFR calc Af Amer: >60 mL/min (ref >60)
GFR calc non Af Amer: >60 mL/min (ref >60)
Glucose, Bld: 102 mg/dL — ABNORMAL HIGH (ref 70–99)
Potassium: 3.6 mmol/L (ref 3.5–5.1)
Sodium: 136 mmol/L (ref 135–145)

## 2018-09-13 LAB — CBC
HEMATOCRIT: 46.8 % (ref 39.0–52.0)
HEMOGLOBIN: 16.1 g/dL (ref 13.0–17.0)
MCH: 31.1 pg (ref 26.0–34.0)
MCHC: 34.4 g/dL (ref 30.0–36.0)
MCV: 90.3 fL (ref 80.0–100.0)
Platelets: 251 10*3/uL (ref 150–400)
RBC: 5.18 MIL/uL (ref 4.22–5.81)
RDW: 12.7 % (ref 11.5–15.5)
WBC: 12.2 10*3/uL — ABNORMAL HIGH (ref 4.0–10.5)
nRBC: 0 % (ref 0.0–0.2)

## 2018-09-13 LAB — TROPONIN I
Troponin I: <0.03 ng/mL (ref <0.03)
Troponin I: <0.03 ng/mL (ref <0.03)

## 2018-09-13 MED ORDER — IPRATROPIUM-ALBUTEROL 0.5-2.5 (3) MG/3ML IN SOLN
3.0000 mL | Freq: Once | RESPIRATORY_TRACT | Status: AC
  Administered 2018-09-13: 3 mL via RESPIRATORY_TRACT
  Filled 2018-09-13: qty 3

## 2018-09-13 MED ORDER — PREDNISONE 20 MG PO TABS: 60.0000 mg | ORAL_TABLET | Freq: Every day | ORAL | 0 refills | Status: AC

## 2018-09-13 MED ORDER — IOHEXOL 350 MG/ML SOLN
75.0000 mL | Freq: Once | INTRAVENOUS | Status: AC | PRN
  Administered 2018-09-13: 75 mL via INTRAVENOUS

## 2018-09-13 MED ORDER — OXYCODONE-ACETAMINOPHEN 5-325 MG PO TABS
1.0000 | ORAL_TABLET | Freq: Once | ORAL | Status: AC
  Administered 2018-09-13: 1 via ORAL
  Filled 2018-09-13: qty 1

## 2018-09-13 MED ORDER — PREDNISONE 20 MG PO TABS
60.0000 mg | ORAL_TABLET | Freq: Once | ORAL | Status: AC
  Administered 2018-09-13: 60 mg via ORAL
  Filled 2018-09-13: qty 3

## 2018-09-13 MED ORDER — DOXYCYCLINE HYCLATE 100 MG PO CAPS: 100.0000 mg | ORAL_CAPSULE | Freq: Two times a day (BID) | ORAL | 0 refills | Status: AC

## 2018-09-13 MED ORDER — ALBUTEROL SULFATE HFA 108 (90 BASE) MCG/ACT IN AERS: 2.0000 | INHALATION_SPRAY | Freq: Four times a day (QID) | RESPIRATORY_TRACT | 2 refills | Status: AC | PRN

## 2018-09-13 MED ORDER — TRAMADOL HCL 50 MG PO TABS
50.0000 mg | ORAL_TABLET | Freq: Once | ORAL | Status: AC
  Administered 2018-09-13: 50 mg via ORAL
  Filled 2018-09-13: qty 1

## 2018-09-13 MED ORDER — SODIUM CHLORIDE 0.9% FLUSH: 3.0000 mL | Freq: Once | INTRAVENOUS | Status: DC

## 2018-09-13 NOTE — ED Notes (Signed)
Report to Gracie, RN 

## 2018-09-13 NOTE — ED Provider Notes (Signed)
[facility] Emergency Department Provider Note  ____________________________________________  Time seen: Approximately 8:35 AM  I have reviewed the triage vital signs and the nursing notes.   HISTORY  Chief Complaint Chest Pain   HPI Walter Morales is a 48 y.o. male with a history of smoking and spontaneous pneumothorax who presents for evaluation of chest pain and shortness of breath.  Patient reports that he woke up this morning feeling very short of breath.  He is complaining of pressure/sharp pain located in the center of his chest radiating to his back.  The pain is pleuritic in nature.  He denies cough or congestion.  Denies any personal history of blood clots.  Patient does believe that his mother has had a blood clot in her legs before.  No hemoptysis, no recent travel immobilization, no leg pain or swelling, no exogenous hormones or history of cancer.  Patient is a smoker.  According to him he does not have a history of asthma or officially diagnosed COPD.  Patient also has long fh of heart disease but no personal history.   Past Medical History:  Diagnosis Date  . Asthma   . Migraine   . Spontaneous pneumothorax     There are no active problems to display for this patient.   Past Surgical History:  Procedure Laterality Date  . ROTATOR CUFF REPAIR      Prior to Admission medications   Medication Sig Start Date End Date Taking? Authorizing Provider  metaxalone (SKELAXIN) 800 MG tablet Take 800 mg by mouth 3 (three) times daily.   Yes [provider]  ranitidine (ZANTAC) 150 MG tablet Take 1 tablet (150 mg total) by mouth 2 (two) times daily. 02/07/17 09/13/18 Yes Enid Derry, PA-C  albuterol (PROVENTIL HFA;VENTOLIN HFA) 108 (90 Base) MCG/ACT inhaler Inhale 2 puffs into the lungs every 6 (six) hours as needed for wheezing or shortness of breath. 09/13/18   Nita Sickle, MD  doxycycline (VIBRAMYCIN) 100 MG capsule Take 1 capsule  (100 mg total) by mouth 2 (two) times daily for 5 days. 09/13/18 09/18/18  Nita Sickle, MD  predniSONE (DELTASONE) 20 MG tablet Take 3 tablets (60 mg total) by mouth daily for 4 days. 09/13/18 09/17/18  Nita Sickle, MD    Allergies Patient has no known allergies.  History reviewed. No pertinent family history.  Social History Social History   Tobacco Use  . Smoking status: Current Every Day Smoker    Packs/day: 0.50    Types: Cigarettes  . Smokeless tobacco: Former Engineer, water Use Topics  . Alcohol use: No  . Drug use: No    Review of Systems  Constitutional: Negative for fever. Eyes: Negative for visual changes. ENT: Negative for sore throat. Neck: No neck pain  Cardiovascular: + chest pain. Respiratory: + shortness of breath. Gastrointestinal: Negative for abdominal pain, vomiting or diarrhea. Genitourinary: Negative for dysuria. Musculoskeletal: Negative for back pain. Skin: Negative for rash. Neurological: Negative for headaches, weakness or numbness. Psych: No SI or HI  ____________________________________________   PHYSICAL EXAM:  VITAL SIGNS: ED Triage Vitals  Enc Vitals Group     BP 09/13/18 0629 132/79     Pulse Rate 09/13/18 0629 69     Resp 09/13/18 0629 20     Temp 09/13/18 0629 97.7 F (36.5 C)     Temp Source 09/13/18 0629 Oral     SpO2 09/13/18 0629 97 %     Weight 09/13/18 0637 135 lb (61.2 kg)  Height 09/13/18 0637 5\' 8"  (1.727 m)     Head Circumference --      Peak Flow --      Pain Score 09/13/18 0636 10     Pain Loc --      Pain Edu? --      Excl. in [place]? --     Constitutional: Alert and oriented. Well appearing and in no apparent distress. HEENT:      Head: Normocephalic and atraumatic.         Eyes: Conjunctivae are normal. Sclera is non-icteric.       Mouth/Throat: Mucous membranes are moist.       Neck: Supple with no signs of meningismus. Cardiovascular: Regular rate and rhythm. No murmurs, gallops, or rubs. 2+  symmetrical distal pulses are present in all extremities. No JVD. Respiratory: Normal respiratory effort, normal sats, diminished air movement bilaterally with no wheezes or crackles. Gastrointestinal: Soft, non tender, and non distended with positive bowel sounds. No rebound or guarding. Musculoskeletal: Nontender with normal range of motion in all extremities. No edema, cyanosis, or erythema of extremities. Neurologic: Normal speech and language. Face is symmetric. Moving all extremities. No gross focal neurologic deficits are appreciated. Skin: Skin is warm, dry and intact. No rash noted. Psychiatric: Mood and affect are normal. Speech and behavior are normal.  ____________________________________________   LABS (all labs ordered are listed, but only abnormal results are displayed)  Labs Reviewed  BASIC METABOLIC PANEL - Abnormal; Notable for the following components:      Result Value   Glucose, Bld 102 (*)    Creatinine, Ser 0.58 (*)    Calcium 8.7 (*)    All other components within normal limits  CBC - Abnormal; Notable for the following components:   WBC 12.2 (*)    All other components within normal limits  TROPONIN I  TROPONIN I  LIPASE, BLOOD  HEPATIC FUNCTION PANEL   ____________________________________________  EKG  ED ECG REPORT I, Nita Sicklearolina Chakara Bognar, the attending physician, personally viewed and interpreted this ECG.  Normal sinus rhythm, rate of 70, normal intervals, right axis deviation, no ST elevations or depressions.  No significant changes when compared to prior. ____________________________________________  RADIOLOGY  I have personally reviewed the images performed during this visit and I agree with the Radiologist's read.   Interpretation by Radiologist:  Dg Chest 2 View  Result Date: 09/13/2018 CLINICAL DATA:  Shortness of breath and chest pain EXAM: CHEST - 2 VIEW COMPARISON:  07/20/2016 FINDINGS: Hyperinflation and borderline interstitial  coarsening. Postoperative right apical lung with sutures. Biapical pleural based scarring. Normal heart size and mediastinal contours. 7 mm nodular density overlapping the left lung, favor nipple shadow, with similar size nipple shadow likely seen on the lateral view IMPRESSION: 1. No evidence of acute disease. 2. Suspect chronic bronchitis. 3. 7 mm nodular density on the left, favor nipple shadow. Given smoking history consider follow-up with nipple markers versus CT. Electronically Signed   By: Marnee SpringJonathon  Watts M.D.   On: 09/13/2018 07:09   Ct Angio Chest Pe W And/or Wo Contrast  Result Date: 09/13/2018 CLINICAL DATA:  Acute presentation with chest pain. History of previous pneumothorax. EXAM: CT ANGIOGRAPHY CHEST WITH CONTRAST TECHNIQUE: Multidetector CT imaging of the chest was performed using the standard protocol during bolus administration of intravenous contrast. Multiplanar CT image reconstructions and MIPs were obtained to evaluate the vascular anatomy. CONTRAST:  75mL OMNIPAQUE IOHEXOL 350 MG/ML SOLN COMPARISON:  Radiography same day. FINDINGS: Cardiovascular: Pulmonary arterial opacification  is good. No pulmonary emboli. No aortic atherosclerosis. No aneurysm or dissection. No visible coronary artery calcification. Heart size is normal. Mediastinum/Nodes: No mass or lymphadenopathy. Lungs/Pleura: Background pattern of emphysema. Pulmonary scarring in the upper lobes, right more than left. No pulmonary mass or nodule. Density at chest radiography probably relates to the nipple shadow. No pleural fluid. No pneumothorax. Upper Abdomen: Negative Musculoskeletal: Normal Review of the MIP images confirms the above findings. IMPRESSION: No acute finding by CT. Background pattern of emphysema and upper lobe scarring. No pneumothorax. No acute vascular finding. Nodule on the left questioned at radiography probably represents the nipple shadow. Electronically Signed   By: Paulina Fusi M.D.   On: 09/13/2018 10:26      ____________________________________________   PROCEDURES  Procedure(s) performed: None Procedures Critical Care performed:  None ____________________________________________   INITIAL IMPRESSION / ASSESSMENT AND PLAN / ED COURSE  48 y.o. male with a history of smoking and spontaneous pneumothorax who presents for evaluation of pleuritic chest pain and shortness of breath that stared this am.  ddx asthma/ copd exacerbation vs PTX vs PE vs ACS vs PNA.  Patient is well-appearing and in no distress, has normal vital signs, normal work of breathing, diminished air movement bilaterally with no crackles or wheezes.  EKG with no ischemic changes.  First troponin is negative. Slightly elevated WBC 12.2 consistent with possible infectious/ viral process. CXR negative for PTX, PNA, pulmonary edema.  Will give prednisone and duonebs and reassess    _________________________ 12:26 PM on 09/13/2018 -----------------------------------------  CXR with no acute findings.  Patient continued to complain of sharp pleuritic chest pain therefore he was sent for CT angiogram which was also negative for PE, pneumonia, pneumothorax.  Patient's pain has resolved at this time after 1 Percocet.  Troponin x2 is negative.  Remaining of his labs showing mild leukocytosis with white count of 12.2.  That in the setting of a productive cough we will treat for bronchitis with albuterol, prednisone, and doxycycline.  Discussed standard return precautions.  Smoking cessation counseling was provided.  Recommended close follow-up with primary care doctor.   As part of my medical decision making, I reviewed the following data within the electronic MEDICAL RECORD NUMBER Nursing notes reviewed and incorporated, Labs reviewed , EKG interpreted , Old EKG reviewed, Old chart reviewed, Radiograph reviewed , Notes from prior ED visits and [place] Controlled Substance Database    Pertinent labs & imaging results that were available  during my care of the patient were reviewed by me and considered in my medical decision making (see chart for details).    ____________________________________________   FINAL CLINICAL IMPRESSION(S) / ED DIAGNOSES  Final diagnoses:  Chest pain, unspecified type  Bronchitis      NEW MEDICATIONS STARTED DURING THIS VISIT:  ED Discharge Orders         Ordered    predniSONE (DELTASONE) 20 MG tablet  Daily     09/13/18 1226    doxycycline (VIBRAMYCIN) 100 MG capsule  2 times daily     09/13/18 1226    albuterol (PROVENTIL HFA;VENTOLIN HFA) 108 (90 Base) MCG/ACT inhaler  Every 6 hours PRN     09/13/18 1226           Note:  This document was prepared using Dragon voice recognition software and may include unintentional dictation errors.    Don Perking, [place], MD 09/13/18 1228

## 2018-09-13 NOTE — ED Notes (Signed)
First Nurse Note: Patient wanting to go outside, expressing concern over wait.  No new complaints verbalized.  Alert and oriented.

## 2018-09-13 NOTE — ED Triage Notes (Signed)
Pt arrived to the ED for complaints of chest pain/tighness. Pt reports that he woke up with chest pain and wants to be checked because it is difficult to breath and he has a history of spontaneous pneumothorax. Pt reports cough for the past week. Pt is AOx4 in moderate pain during triage.

## 2019-10-29 IMAGING — CT CT ANGIO CHEST
2 of 6 series · 19 of 46 positions shown · IV contrast (omnipaque)
Comparison: Radiography same day.

CLINICAL DATA: Acute presentation with chest pain. History of
previous pneumothorax.

EXAM:
CT ANGIOGRAPHY CHEST WITH CONTRAST
TECHNIQUE: Multidetector CT imaging of the chest was performed using the
standard protocol during bolus administration of intravenous
contrast. Multiplanar CT image reconstructions and MIPs were
obtained to evaluate the vascular anatomy.
CONTRAST:  75mL OMNIPAQUE IOHEXOL 350 MG/ML SOLN

[Series 5: thins · axial · 0.64mm/px · z∈[-617,-317]mm · 17 of 330 slices shown]
[im 15/330  lung]
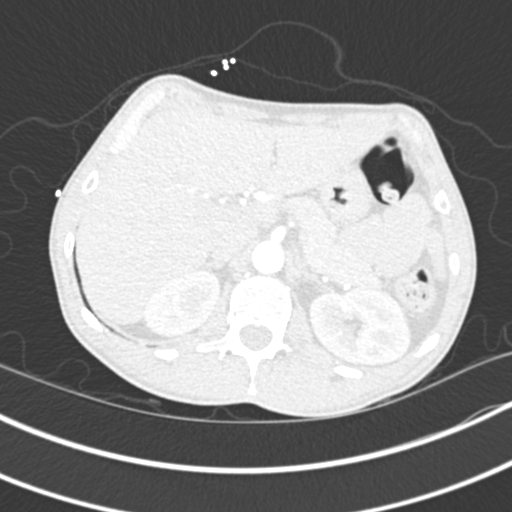
[im 29/330  soft-tissue]
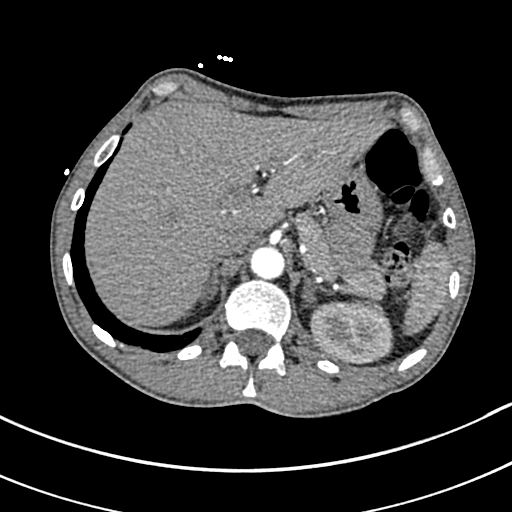
[im 58/330  lung]
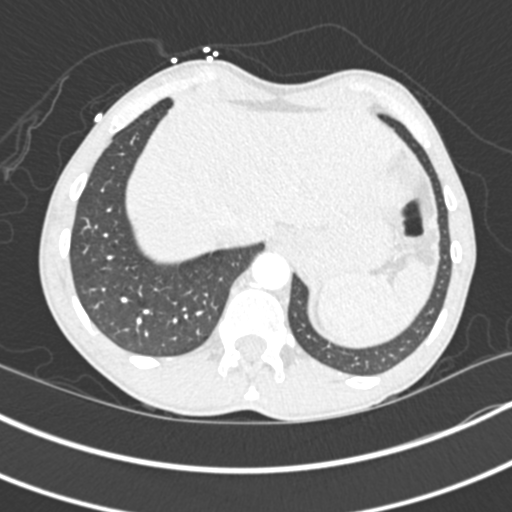
[im 72/330  soft-tissue]
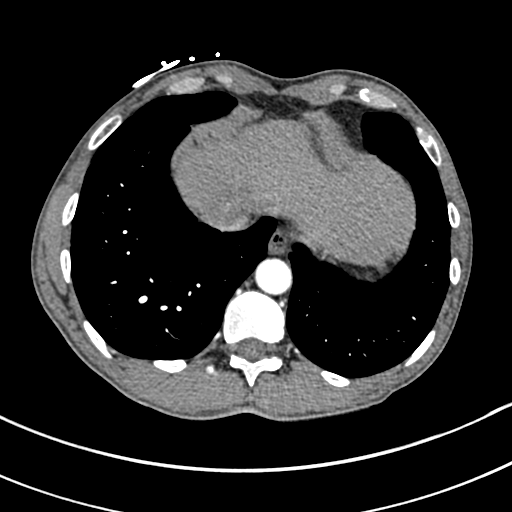
[im 86/330  lung]
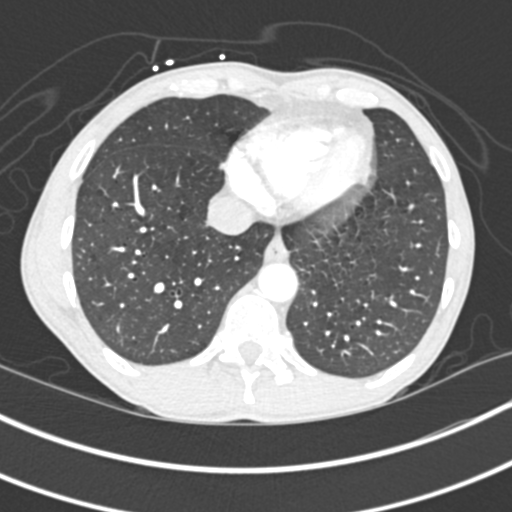
[im 115/330  soft-tissue]
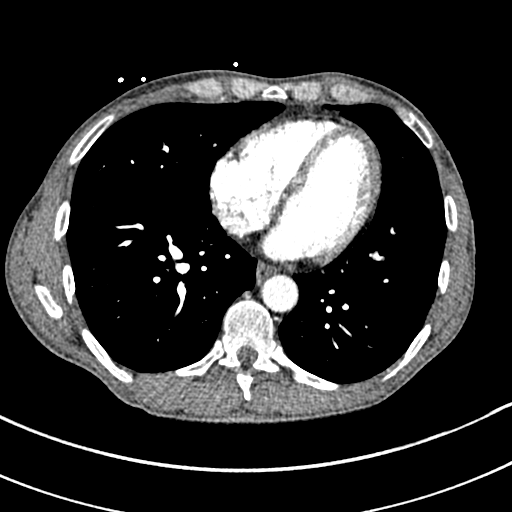
[im 129/330  lung]
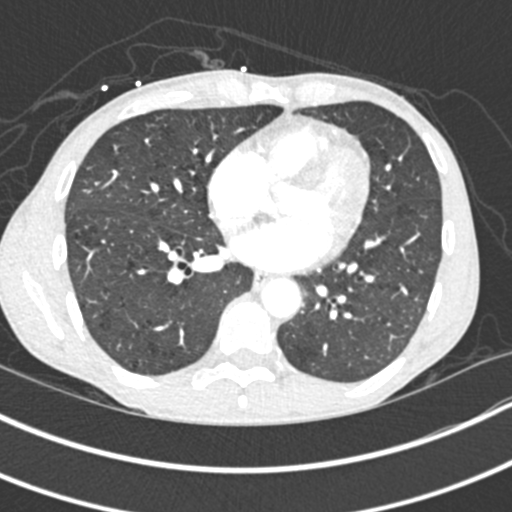
[im 144/330  soft-tissue]
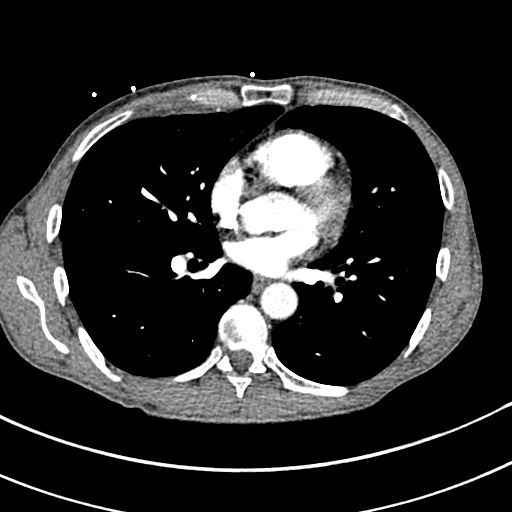
[im 172/330  lung]
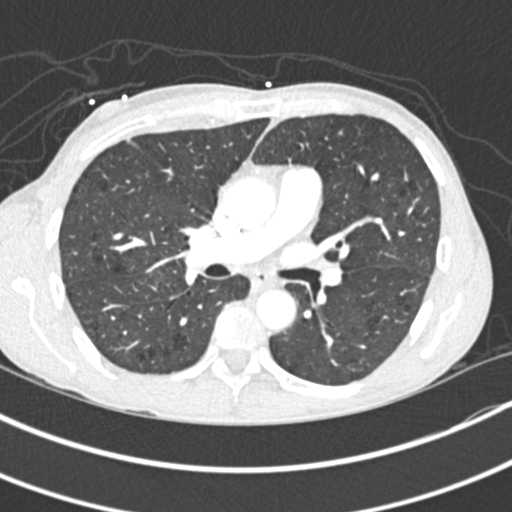
[im 186/330  soft-tissue]
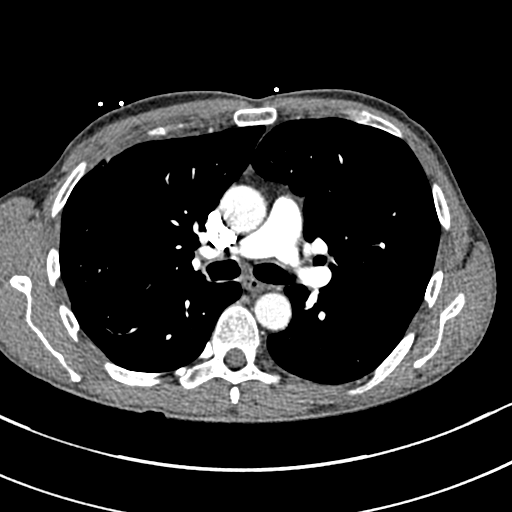
[im 201/330  lung]
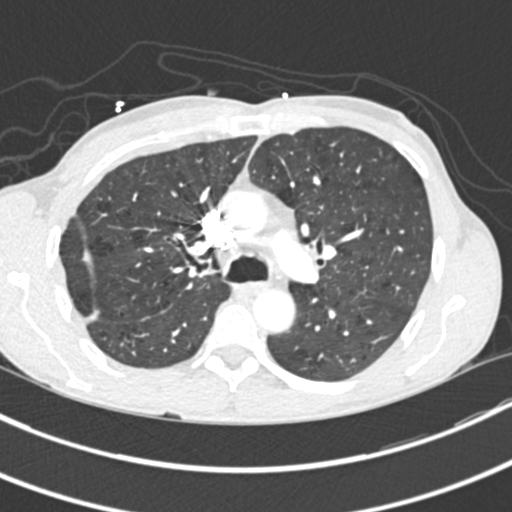
[im 215/330  soft-tissue]
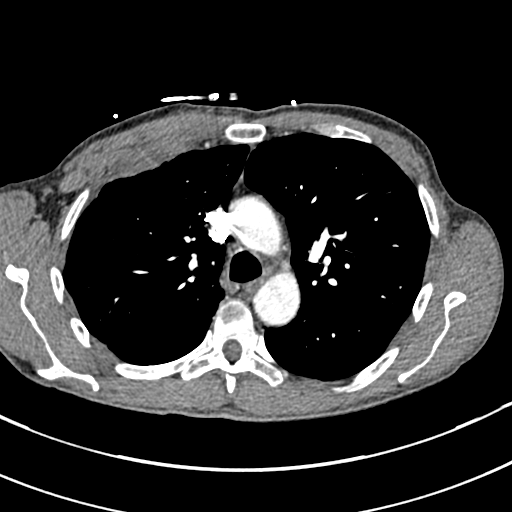
[im 244/330  lung]
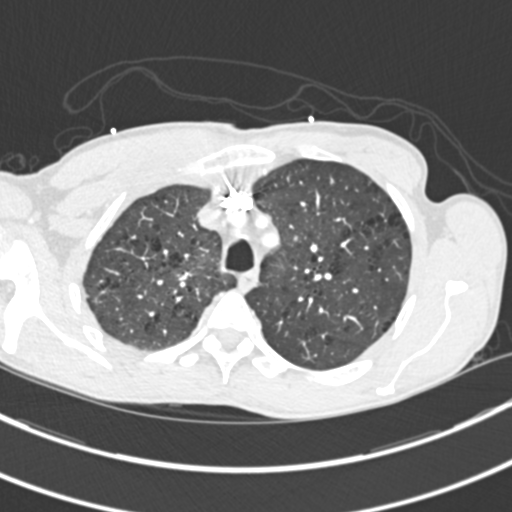
[im 258/330  soft-tissue]
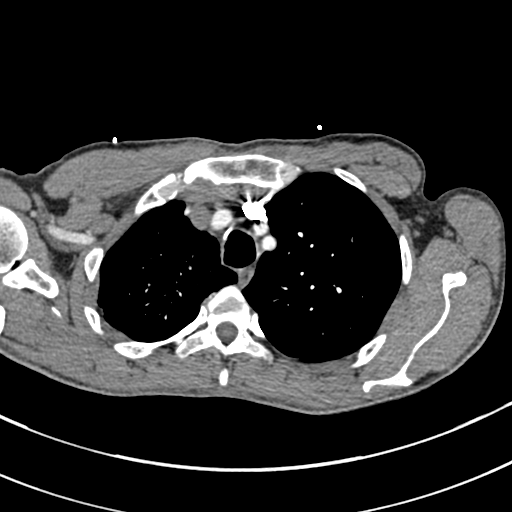
[im 272/330  lung]
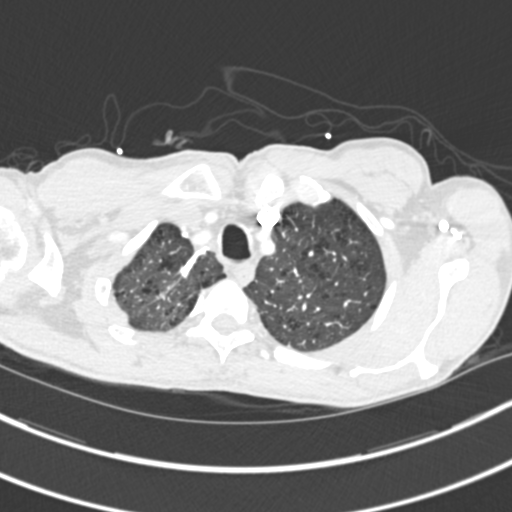
[im 301/330  soft-tissue]
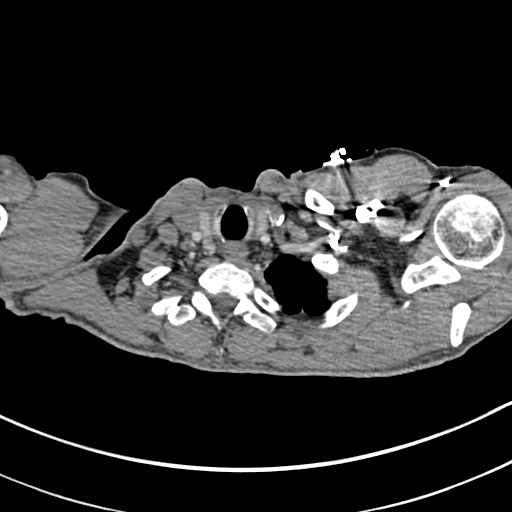
[im 315/330  lung]
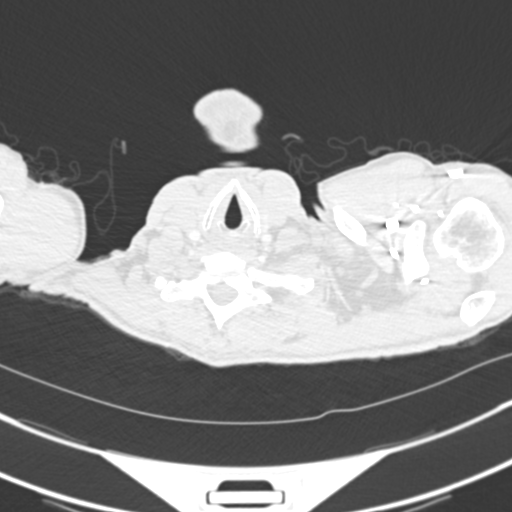

[Series 7: coronal mpr · coronal · 0.65mm/px · 2 of 77 slices shown]
[im 26/77  soft-tissue]
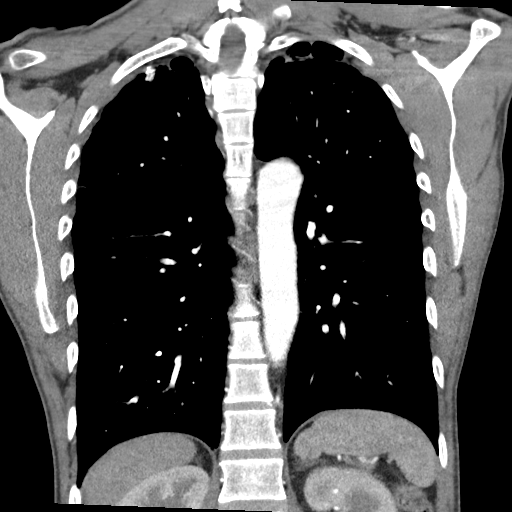
[im 51/77  soft-tissue]
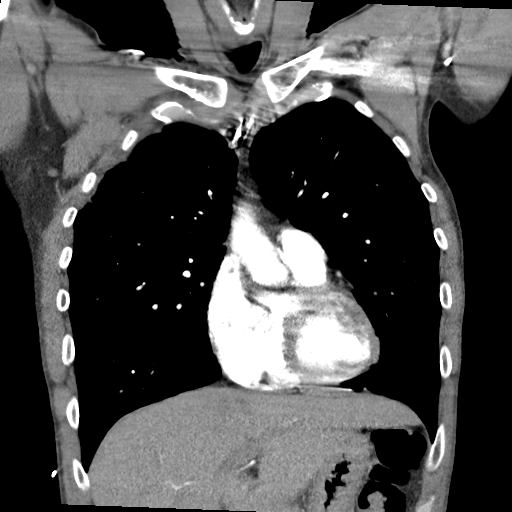

[19 of 46 positions shown; findings below may reference images not displayed]

FINDINGS: Cardiovascular: Pulmonary arterial opacification is good. No
pulmonary emboli. No aortic atherosclerosis. No aneurysm or
dissection. No visible coronary artery calcification. Heart size is
normal.

Mediastinum/Nodes: No mass or lymphadenopathy.

Lungs/Pleura: Background pattern of emphysema. Pulmonary scarring in
the upper lobes, right more than left. No pulmonary mass or nodule.
Density at chest radiography probably relates to the nipple shadow.
No pleural fluid. No pneumothorax.

Upper Abdomen: Negative

Musculoskeletal: Normal

Review of the MIP images confirms the above findings.
IMPRESSION: No acute finding by CT. Background pattern of emphysema and upper
lobe scarring. No pneumothorax. No acute vascular finding. Nodule on
the left questioned at radiography probably represents the nipple
shadow.

## 2019-11-13 ENCOUNTER — Emergency Department
Admission: EM | Admit: 2019-11-13 | Discharge: 2019-11-13 | Disposition: A | Discharge status: Home / Self Care | Payer: Self-pay | Attending: Emergency Medicine | Admitting: Emergency Medicine

## 2019-11-13 ENCOUNTER — Other Ambulatory Visit: Payer: Self-pay

## 2019-11-13 DIAGNOSIS — N342 Other urethritis: Secondary | ICD-10-CM | POA: Insufficient documentation

## 2019-11-13 DIAGNOSIS — N3000 Acute cystitis without hematuria: Secondary | ICD-10-CM | POA: Insufficient documentation

## 2019-11-13 DIAGNOSIS — F17210 Nicotine dependence, cigarettes, uncomplicated: Secondary | ICD-10-CM | POA: Insufficient documentation

## 2019-11-13 DIAGNOSIS — J45909 Unspecified asthma, uncomplicated: Secondary | ICD-10-CM | POA: Insufficient documentation

## 2019-11-13 DIAGNOSIS — R1032 Left lower quadrant pain: Secondary | ICD-10-CM | POA: Insufficient documentation

## 2019-11-13 DIAGNOSIS — R1030 Lower abdominal pain, unspecified: Secondary | ICD-10-CM | POA: Insufficient documentation

## 2019-11-13 LAB — COMPREHENSIVE METABOLIC PANEL
ALT: 13 U/L (ref 0–44)
AST: 23 U/L (ref 15–41)
Albumin: 3.5 g/dL (ref 3.5–5.0)
Alkaline Phosphatase: 76 U/L (ref 38–126)
Anion gap: 10 (ref 5–15)
BUN: 7 mg/dL (ref 6–20)
CO2: 25 mmol/L (ref 22–32)
Calcium: 8.8 mg/dL — ABNORMAL LOW (ref 8.9–10.3)
Chloride: 101 mmol/L (ref 98–111)
Creatinine, Ser: 0.75 mg/dL (ref 0.61–1.24)
GFR calc Af Amer: >60 mL/min (ref >60)
GFR calc non Af Amer: >60 mL/min (ref >60)
Glucose, Bld: 179 mg/dL — ABNORMAL HIGH (ref 70–99)
Potassium: 2.9 mmol/L — ABNORMAL LOW (ref 3.5–5.1)
Sodium: 136 mmol/L (ref 135–145)
Total Bilirubin: 0.4 mg/dL (ref 0.3–1.2)
Total Protein: 7 g/dL (ref 6.5–8.1)

## 2019-11-13 LAB — CBC
HCT: 46 % (ref 39.0–52.0)
Hemoglobin: 15.9 g/dL (ref 13.0–17.0)
MCH: 31.2 pg (ref 26.0–34.0)
MCHC: 34.6 g/dL (ref 30.0–36.0)
MCV: 90.4 fL (ref 80.0–100.0)
Platelets: 264 10*3/uL (ref 150–400)
RBC: 5.09 MIL/uL (ref 4.22–5.81)
RDW: 12.6 % (ref 11.5–15.5)
WBC: 11.4 10*3/uL — ABNORMAL HIGH (ref 4.0–10.5)
nRBC: 0 % (ref 0.0–0.2)

## 2019-11-13 LAB — URINALYSIS, COMPLETE (UACMP) WITH MICROSCOPIC
Bacteria, UA: NONE SEEN
Bilirubin Urine: NEGATIVE
Glucose, UA: 50 mg/dL — AB
Hgb urine dipstick: NEGATIVE
Ketones, ur: NEGATIVE mg/dL
Nitrite: NEGATIVE
Protein, ur: 30 mg/dL — AB
Specific Gravity, Urine: 1.024 (ref 1.005–1.030)
WBC, UA: >50 WBC/hpf — ABNORMAL HIGH (ref 0–5)
pH: 5 (ref 5.0–8.0)

## 2019-11-13 LAB — CHLAMYDIA/NGC RT PCR (ARMC ONLY)
Chlamydia Tr: NOT DETECTED
N gonorrhoeae: DETECTED — AB

## 2019-11-13 LAB — LIPASE, BLOOD: Lipase: 23 U/L (ref 11–51)

## 2019-11-13 MED ORDER — POTASSIUM CHLORIDE CRYS ER 20 MEQ PO TBCR
40.0000 meq | EXTENDED_RELEASE_TABLET | Freq: Once | ORAL | Status: AC
  Administered 2019-11-13: 40 meq via ORAL
  Filled 2019-11-13: qty 2

## 2019-11-13 MED ORDER — AZITHROMYCIN 1 G PO PACK
1.0000 g | PACK | Freq: Once | ORAL | Status: AC
  Administered 2019-11-13: 1 g via ORAL
  Filled 2019-11-13: qty 1

## 2019-11-13 MED ORDER — LIDOCAINE HCL (PF) 1 % IJ SOLN
INTRAMUSCULAR | Status: AC
  Administered 2019-11-13: 2.1 mL
  Filled 2019-11-13: qty 5

## 2019-11-13 MED ORDER — CEPHALEXIN 500 MG PO CAPS: 500.0000 mg | ORAL_CAPSULE | Freq: Two times a day (BID) | ORAL | 0 refills | Status: AC

## 2019-11-13 MED ORDER — CEFTRIAXONE SODIUM 1 G IJ SOLR
500.0000 mg | Freq: Once | INTRAMUSCULAR | Status: AC
  Administered 2019-11-13: 500 mg via INTRAMUSCULAR
  Filled 2019-11-13: qty 10

## 2019-11-13 NOTE — ED Provider Notes (Signed)
[facility] Emergency Department Provider Note   ____________________________________________   First MD Initiated Contact with Patient 11/13/19 1328     (approximate)  I have reviewed the triage vital signs and the nursing notes.   HISTORY  Chief Complaint Abdominal Pain    HPI Walter Morales is a 49 y.o. male with past medical history of asthma, migraine, spontaneous pneumothorax, and nephrolithiasis who presents to the ED complaining of abdominal pain.  Patient reports that he has had approximately 1 week of pain to his bilateral lower quadrants of his abdomen as well as his suprapubic area.  This is been associated with dysuria but he has not noticed any hematuria, flank pain, fever, nausea, or vomiting.  He does describe symptoms as similar to when he passed 1 kidney stone in the past, but pain less severe.  He is also concerned about a sexual encounter he had with his ex-wife back in [place] last week, denies any penile discharge or groin lesions.        Past Medical History:  Diagnosis Date  . Asthma   . Migraine   . Spontaneous pneumothorax     There are no problems to display for this patient.   Past Surgical History:  Procedure Laterality Date  . ROTATOR CUFF REPAIR      Prior to Admission medications   Medication Sig Start Date End Date Taking? Authorizing Provider  albuterol (PROVENTIL HFA;VENTOLIN HFA) 108 (90 Base) MCG/ACT inhaler Inhale 2 puffs into the lungs every 6 (six) hours as needed for wheezing or shortness of breath. 09/13/18   Alfred Levins, [place], MD  cephALEXin (KEFLEX) 500 MG capsule Take 1 capsule (500 mg total) by mouth 2 (two) times daily for 7 days. 11/13/19 11/20/19  Blake Divine, MD  metaxalone (SKELAXIN) 800 MG tablet Take 800 mg by mouth 3 (three) times daily.    [provider]  ranitidine (ZANTAC) 150 MG tablet Take 1 tablet (150 mg total) by mouth 2 (two) times daily. 02/07/17 09/13/18  Laban Emperor,  PA-C    Allergies Patient has no known allergies.  No family history on file.  Social History Social History   Tobacco Use  . Smoking status: Current Every Day Smoker    Packs/day: 0.50    Types: Cigarettes  . Smokeless tobacco: Former Network engineer Use Topics  . Alcohol use: No  . Drug use: No    Review of Systems  Constitutional: No fever/chills Eyes: No visual changes. ENT: No sore throat. Cardiovascular: Denies chest pain. Respiratory: Denies shortness of breath. Gastrointestinal: Positive for abdominal pain.  No nausea, no vomiting.  No diarrhea.  No constipation. Genitourinary: Positive for dysuria. Musculoskeletal: Negative for back pain. Skin: Negative for rash. Neurological: Negative for headaches, focal weakness or numbness.  ____________________________________________   PHYSICAL EXAM:  VITAL SIGNS: ED Triage Vitals [11/13/19 1234]  Enc Vitals Group     BP (!) 144/88     Pulse Rate 92     Resp 16     Temp 99.1 F (37.3 C)     Temp Source Oral     SpO2 100 %     Weight 135 lb (61.2 kg)     Height 5\' 8"  (1.727 m)     Head Circumference      Peak Flow      Pain Score 9     Pain Loc      Pain Edu?      Excl. in [place]?  Constitutional: Alert and oriented. Eyes: Conjunctivae are normal. Head: Atraumatic. Nose: No congestion/rhinnorhea. Mouth/Throat: Mucous membranes are moist. Neck: Normal ROM Cardiovascular: Normal rate, regular rhythm. Grossly normal heart sounds. Respiratory: Normal respiratory effort.  No retractions. Lungs CTAB. Gastrointestinal: Soft and tender to palpation in the suprapubic area with no rebound or guarding.  No CVA tenderness bilaterally. No distention. Genitourinary: deferred Musculoskeletal: No lower extremity tenderness nor edema. Neurologic:  Normal speech and language. No gross focal neurologic deficits are appreciated. Skin:  Skin is warm, dry and intact. No rash noted. Psychiatric: Mood and affect are  normal. Speech and behavior are normal.  ____________________________________________   LABS (all labs ordered are listed, but only abnormal results are displayed)  Labs Reviewed  COMPREHENSIVE METABOLIC PANEL - Abnormal; Notable for the following components:      Result Value   Potassium 2.9 (*)    Glucose, Bld 179 (*)    Calcium 8.8 (*)    All other components within normal limits  CBC - Abnormal; Notable for the following components:   WBC 11.4 (*)    All other components within normal limits  URINALYSIS, COMPLETE (UACMP) WITH MICROSCOPIC - Abnormal; Notable for the following components:   Color, Urine YELLOW (*)    APPearance CLOUDY (*)    Glucose, UA 50 (*)    Protein, ur 30 (*)    Leukocytes,Ua LARGE (*)    WBC, UA >50 (*)    All other components within normal limits  URINE CULTURE  CHLAMYDIA/NGC RT PCR ([facility] ONLY)  LIPASE, BLOOD     PROCEDURES  Procedure(s) performed (including Critical Care):  Procedures   ____________________________________________   INITIAL IMPRESSION / ASSESSMENT AND PLAN / ED COURSE       49 year old male presents to the ED with approximately 1 week of suprapubic discomfort as well as dysuria, also expressed concern regarding a sexual encounter that he had with his ex-wife last week.  Lab work is remarkable only for hypokalemia, which we will replete.  UA is consistent with UTI, no clinical findings to suggest pyelonephritis however there would also be concerned for urethritis given his reported sexual encounter.  We will treat with Rocephin and azithromycin here, prescribed Keflex for possible cystitis and culture urine.  Patient was offered CT scan to assess for possible obstructing kidney stone, however he declines this and assures me he will return to the ED for worsening symptoms.  He was counseled to follow-up with a PCP and otherwise return to the ED, patient agrees with plan.       ____________________________________________   FINAL CLINICAL IMPRESSION(S) / ED DIAGNOSES  Final diagnoses:  Acute cystitis without hematuria  Urethritis     ED Discharge Orders         Ordered    cephALEXin (KEFLEX) 500 MG capsule  2 times daily     11/13/19 1358           Note:  This document was prepared using Dragon voice recognition software and may include unintentional dictation errors.   Chesley Noon, MD 11/13/19 [phone]

## 2019-11-13 NOTE — ED Triage Notes (Addendum)
Pt reports pain bilaterally below belly button and pain with urination. Denies flank pain. Pt does have hx of kidney stones. Pain has been occurring since Friday. Pt alert and oriented X4, cooperative, RR even and unlabored, color WNL. Pt in NAD.  Pt drove himself here and plans to drive himself back home.

## 2019-11-14 ENCOUNTER — Telehealth: Payer: Self-pay | Admitting: Emergency Medicine

## 2019-11-15 LAB — URINE CULTURE: Culture: NO GROWTH

## 2020-02-24 ENCOUNTER — Emergency Department
Admission: EM | Admit: 2020-02-24 | Discharge: 2020-02-24 | Disposition: A | Discharge status: Home / Self Care | Payer: PRIVATE HEALTH INSURANCE | Attending: Emergency Medicine | Admitting: Emergency Medicine

## 2020-02-24 ENCOUNTER — Other Ambulatory Visit: Payer: Self-pay

## 2020-02-24 ENCOUNTER — Encounter: Payer: Self-pay | Admitting: Physician Assistant

## 2020-02-24 ENCOUNTER — Emergency Department: Payer: PRIVATE HEALTH INSURANCE

## 2020-02-24 DIAGNOSIS — M545 Low back pain, unspecified: Secondary | ICD-10-CM

## 2020-02-24 DIAGNOSIS — R1032 Left lower quadrant pain: Secondary | ICD-10-CM | POA: Insufficient documentation

## 2020-02-24 DIAGNOSIS — F17210 Nicotine dependence, cigarettes, uncomplicated: Secondary | ICD-10-CM | POA: Insufficient documentation

## 2020-02-24 DIAGNOSIS — Z79899 Other long term (current) drug therapy: Secondary | ICD-10-CM | POA: Insufficient documentation

## 2020-02-24 DIAGNOSIS — J45909 Unspecified asthma, uncomplicated: Secondary | ICD-10-CM | POA: Insufficient documentation

## 2020-02-24 LAB — URINALYSIS, COMPLETE (UACMP) WITH MICROSCOPIC
Bacteria, UA: NONE SEEN
Bilirubin Urine: NEGATIVE
Glucose, UA: NEGATIVE mg/dL
Hgb urine dipstick: NEGATIVE
Ketones, ur: NEGATIVE mg/dL
Leukocytes,Ua: NEGATIVE
Nitrite: NEGATIVE
Protein, ur: NEGATIVE mg/dL
Specific Gravity, Urine: 1.004 — ABNORMAL LOW (ref 1.005–1.030)
Squamous Epithelial / HPF: NONE SEEN (ref 0–5)
pH: 7 (ref 5.0–8.0)

## 2020-02-24 MED ORDER — MELOXICAM 15 MG PO TABS: 15.0000 mg | ORAL_TABLET | Freq: Every day | ORAL | 2 refills | Status: DC

## 2020-02-24 MED ORDER — TRAMADOL HCL 50 MG PO TABS: 50.0000 mg | ORAL_TABLET | Freq: Four times a day (QID) | ORAL | 0 refills | Status: DC | PRN

## 2020-02-24 MED ORDER — BACLOFEN 10 MG PO TABS: 10.0000 mg | ORAL_TABLET | Freq: Three times a day (TID) | ORAL | 1 refills | Status: DC

## 2020-02-24 MED ORDER — KETOROLAC TROMETHAMINE 30 MG/ML IJ SOLN
30.0000 mg | Freq: Once | INTRAMUSCULAR | Status: AC
  Administered 2020-02-24: 30 mg via INTRAMUSCULAR
  Filled 2020-02-24: qty 1

## 2020-02-24 MED ORDER — LIDOCAINE 5 % EX PTCH
1.0000 | MEDICATED_PATCH | CUTANEOUS | Status: DC
  Filled 2020-02-24: qty 1

## 2020-02-24 NOTE — ED Provider Notes (Signed)
[facility] Emergency Department Provider Note  ____________________________________________   First MD Initiated Contact with Patient 02/24/20 1013     (approximate)  I have reviewed the triage vital signs and the nursing notes.   HISTORY  Chief Complaint Back Pain    HPI Walter Morales is a 49 y.o. male presents emergency department complaint of left-sided lower back pain which is turning into flank pain at this time.  No blood in his urine.  History of kidney stone several years ago.  Patient denies any known injury.  States more painful with movement.  Has been taking aspirin for pain without any relief.  Denies fever chills  Past Medical History:  Diagnosis Date  . Asthma   . Migraine   . Spontaneous pneumothorax     There are no problems to display for this patient.   Past Surgical History:  Procedure Laterality Date  . ROTATOR CUFF REPAIR      Prior to Admission medications   Medication Sig Start Date End Date Taking? Authorizing Provider  albuterol (PROVENTIL HFA;VENTOLIN HFA) 108 (90 Base) MCG/ACT inhaler Inhale 2 puffs into the lungs every 6 (six) hours as needed for wheezing or shortness of breath. 09/13/18   Nita Sickle, MD  baclofen (LIORESAL) 10 MG tablet Take 1 tablet (10 mg total) by mouth 3 (three) times daily. 02/24/20 02/23/21  Ryun Velez, Roselyn Bering, PA-C  meloxicam (MOBIC) 15 MG tablet Take 1 tablet (15 mg total) by mouth daily. 02/24/20 02/23/21  Walta Bellville, Roselyn Bering, PA-C  metaxalone (SKELAXIN) 800 MG tablet Take 800 mg by mouth 3 (three) times daily.    [provider]  ranitidine (ZANTAC) 150 MG tablet Take 1 tablet (150 mg total) by mouth 2 (two) times daily. 02/07/17 09/13/18  Enid Derry, PA-C  traMADol (ULTRAM) 50 MG tablet Take 1 tablet (50 mg total) by mouth every 6 (six) hours as needed. 02/24/20   Faythe Ghee, PA-C    Allergies Patient has no known allergies.  History reviewed. No pertinent family  history.  Social History Social History   Tobacco Use  . Smoking status: Current Every Day Smoker    Packs/day: 0.50    Types: Cigarettes  . Smokeless tobacco: Former Engineer, water Use Topics  . Alcohol use: No  . Drug use: No    Review of Systems  Constitutional: No fever/chills Eyes: No visual changes. ENT: No sore throat. Respiratory: Denies cough Cardiovascular: Denies chest pain Gastrointestinal: Denies abdominal pain Genitourinary: Negative for dysuria. Musculoskeletal: Positive for back pain. Skin: Negative for rash. Psychiatric: no mood changes,     ____________________________________________   PHYSICAL EXAM:  VITAL SIGNS: ED Triage Vitals  Enc Vitals Group     BP 02/24/20 0951 133/89     Pulse Rate 02/24/20 0951 67     Resp 02/24/20 0951 16     Temp 02/24/20 0951 98.7 F (37.1 C)     Temp Source 02/24/20 0951 Oral     SpO2 02/24/20 0951 99 %     Weight 02/24/20 0952 135 lb (61.2 kg)     Height 02/24/20 0952 5\' 7"  (1.702 m)     Head Circumference --      Peak Flow --      Pain Score 02/24/20 0951 9     Pain Loc --      Pain Edu? --      Excl. in GC? --     Constitutional: Alert and oriented. Well appearing and  in no acute distress. Eyes: Conjunctivae are normal.  Head: Atraumatic. Nose: No congestion/rhinnorhea. Mouth/Throat: Mucous membranes are moist.   Neck:  supple no lymphadenopathy noted Cardiovascular: Normal rate, regular rhythm. Heart sounds are normal Respiratory: Normal respiratory effort.  No retractions, lungs c t a  Abd: soft tender in the left lower quadrant, bs normal all 4 quad GU: deferred Musculoskeletal: FROM all extremities, warm and well perfused, lumbar spine is minimally tender, patient has reproduced pain with movement. Neurologic:  Normal speech and language.  Skin:  Skin is warm, dry and intact. No rash noted. Psychiatric: Mood and affect are normal. Speech and behavior are  normal.  ____________________________________________   LABS (all labs ordered are listed, but only abnormal results are displayed)  Labs Reviewed  URINALYSIS, COMPLETE (UACMP) WITH MICROSCOPIC - Abnormal; Notable for the following components:      Result Value   Color, Urine COLORLESS (*)    APPearance CLEAR (*)    Specific Gravity, Urine 1.004 (*)    All other components within normal limits   ____________________________________________   ____________________________________________  RADIOLOGY  X-ray lumbar spine is negative  ____________________________________________   PROCEDURES  Procedure(s) performed: Toradol 30 mg IM   Procedures    ____________________________________________   INITIAL IMPRESSION / ASSESSMENT AND PLAN / ED COURSE  Pertinent labs & imaging results that were available during my care of the patient were reviewed by me and considered in my medical decision making (see chart for details).   Patient 49 year old male presents emergency department with low back pain/flank pain on the left side.  See HPI physical exam does show the patient has reproduced pain with movement.  DDx: Muscle strain, flank pain, kidney stone  UA, lumbar spine x-ray  UA is normal, x-ray lumbar spine is negative  On reexamination patient still has difficulty with movement.  Feel this is strictly musculoskeletal.  He was given Toradol 30 mg IM while here which took the edge off but he still having pain.  We will apply a Lidoderm patch prior to discharge.  Was given a prescription for meloxicam, baclofen, and tramadol.  He was also instructed not to drive heavy machinery while taking the muscle relaxer and tramadol.  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  He was discharged in stable condition.      As part of my medical decision making, I reviewed the following data within the electronic MEDICAL RECORD NUMBER Nursing notes reviewed and incorporated, Labs  reviewed , Old chart reviewed, Radiograph reviewed , Notes from prior ED visits and [place] Controlled Substance Database  ____________________________________________   FINAL CLINICAL IMPRESSION(S) / ED DIAGNOSES  Final diagnoses:  Acute left-sided low back pain without sciatica      NEW MEDICATIONS STARTED DURING THIS VISIT:  New Prescriptions   BACLOFEN (LIORESAL) 10 MG TABLET    Take 1 tablet (10 mg total) by mouth 3 (three) times daily.   MELOXICAM (MOBIC) 15 MG TABLET    Take 1 tablet (15 mg total) by mouth daily.   TRAMADOL (ULTRAM) 50 MG TABLET    Take 1 tablet (50 mg total) by mouth every 6 (six) hours as needed.     Note:  This document was prepared using Dragon voice recognition software and may include unintentional dictation errors.    Faythe Ghee, PA-C 02/24/20 1217    Dionne Bucy, MD 02/24/20 1230

## 2020-02-24 NOTE — Discharge Instructions (Signed)
Apply ice to the lower back.  Take medications as needed for pain.  Do not operate heavy machinery while taking the muscle relaxer (baclofen) or tramadol.  You may take the meloxicam while driving. Return to the emergency department if worsening. Follow-up with [facility] orthopedics if not improving in 1 week.

## 2020-02-24 NOTE — ED Triage Notes (Signed)
Pt c/o left sided back pain, hurts to sit, hurts to move. Denies any injury

## 2020-02-24 NOTE — ED Notes (Signed)
Pt ambulatory from triage with slow but steady gait. Pt reports pain started yesterday with no known injury. Has tried heating pad and has been taking aspirin for pain. Denies dysuria.

## 2020-02-27 ENCOUNTER — Other Ambulatory Visit: Payer: Self-pay

## 2020-02-27 ENCOUNTER — Emergency Department
Admission: EM | Admit: 2020-02-27 | Discharge: 2020-02-27 | Disposition: A | Discharge status: Home / Self Care | Payer: PRIVATE HEALTH INSURANCE | Attending: Emergency Medicine | Admitting: Emergency Medicine

## 2020-02-27 ENCOUNTER — Encounter: Payer: Self-pay | Admitting: Emergency Medicine

## 2020-02-27 DIAGNOSIS — Z79899 Other long term (current) drug therapy: Secondary | ICD-10-CM | POA: Insufficient documentation

## 2020-02-27 DIAGNOSIS — M545 Low back pain, unspecified: Secondary | ICD-10-CM

## 2020-02-27 DIAGNOSIS — R519 Headache, unspecified: Secondary | ICD-10-CM | POA: Insufficient documentation

## 2020-02-27 DIAGNOSIS — F17210 Nicotine dependence, cigarettes, uncomplicated: Secondary | ICD-10-CM | POA: Insufficient documentation

## 2020-02-27 DIAGNOSIS — J45909 Unspecified asthma, uncomplicated: Secondary | ICD-10-CM | POA: Insufficient documentation

## 2020-02-27 NOTE — ED Triage Notes (Signed)
Pt reports was seen here on 7/18 for back pain and was given meloxicam and tramadol for pain. Pt reports he took the meds but they are not helping with pain and caused him to have a HA. Pt reports can't go back to work due to pain. Pt admits that he has not made his follow up appt yet. Discussed the importance of following up and pt states he will call today.

## 2020-02-27 NOTE — ED Provider Notes (Signed)
[facility] Emergency Department Provider Note  ____________________________________________   First MD Initiated Contact with Patient 02/27/20 1005     (approximate)  I have reviewed the triage vital signs and the nursing notes.   HISTORY  Chief Complaint Back Pain and Headache    HPI Walter Morales is a 49 y.o. male presents emergency department complaint of headache and nausea from the tramadol that he took yesterday.  He was unable to go to work that time.  States that the meloxicam and baclofen has been helping the back pain and he does feel better with this medication.  States he had to miss work and needs a note for work.  He states he will follow-up with orthopedics as previously directed if he is not better in 2 days.    Past Medical History:  Diagnosis Date  . Asthma   . Migraine   . Spontaneous pneumothorax     There are no problems to display for this patient.   Past Surgical History:  Procedure Laterality Date  . ROTATOR CUFF REPAIR      Prior to Admission medications   Medication Sig Start Date End Date Taking? Authorizing Provider  albuterol (PROVENTIL HFA;VENTOLIN HFA) 108 (90 Base) MCG/ACT inhaler Inhale 2 puffs into the lungs every 6 (six) hours as needed for wheezing or shortness of breath. 09/13/18   Nita Sickle, MD  baclofen (LIORESAL) 10 MG tablet Take 1 tablet (10 mg total) by mouth 3 (three) times daily. 02/24/20 02/23/21  Sebastiano Luecke, Roselyn Bering, PA-C  meloxicam (MOBIC) 15 MG tablet Take 1 tablet (15 mg total) by mouth daily. 02/24/20 02/23/21  Evelena Masci, Roselyn Bering, PA-C  ranitidine (ZANTAC) 150 MG tablet Take 1 tablet (150 mg total) by mouth 2 (two) times daily. 02/07/17 09/13/18  Enid Derry, PA-C  traMADol (ULTRAM) 50 MG tablet Take 1 tablet (50 mg total) by mouth every 6 (six) hours as needed. 02/24/20   Faythe Ghee, PA-C    Allergies Patient has no known allergies.  No family history on file.  Social History Social  History   Tobacco Use  . Smoking status: Current Every Day Smoker    Packs/day: 0.50    Types: Cigarettes  . Smokeless tobacco: Former Engineer, water Use Topics  . Alcohol use: No  . Drug use: No    Review of Systems  Constitutional: No fever/chills, positive headache Eyes: No visual changes. ENT: No sore throat. Respiratory: Denies cough Cardiovascular: Denies chest pain Gastrointestinal: Denies abdominal pain, positive for vomiting that is resolved Genitourinary: Negative for dysuria. Musculoskeletal: Positive for back pain. Skin: Negative for rash. Psychiatric: no mood changes,     ____________________________________________   PHYSICAL EXAM:  VITAL SIGNS: ED Triage Vitals  Enc Vitals Group     BP 02/27/20 0924 (!) 142/83     Pulse Rate 02/27/20 0924 (!) 59     Resp 02/27/20 0924 20     Temp 02/27/20 0924 98.2 F (36.8 C)     Temp Source 02/27/20 0924 Oral     SpO2 02/27/20 0924 99 %     Weight 02/27/20 0922 135 lb (61.2 kg)     Height 02/27/20 0922 5\' 7"  (1.702 m)     Head Circumference --      Peak Flow --      Pain Score 02/27/20 0922 7     Pain Loc --      Pain Edu? --      Excl. in GC? --  Constitutional: Alert and oriented. Well appearing and in no acute distress. Eyes: Conjunctivae are normal.  Head: Atraumatic. Nose: No congestion/rhinnorhea. Mouth/Throat: Mucous membranes are moist.   Neck:  supple no lymphadenopathy noted Cardiovascular: Normal rate, regular rhythm. Heart sounds are normal Respiratory: Normal respiratory effort.  No retractions, lungs c t a  Abd: soft nontender bs normal all 4 quad GU: deferred Musculoskeletal: FROM all extremities, warm and well perfused, lumbar spine and flank area are still slightly tender, patient has better movement in all previous day, neurovascular is intact Neurologic:  Normal speech and language.  Skin:  Skin is warm, dry and intact. No rash noted. Psychiatric: Mood and affect are normal.  Speech and behavior are normal.  ____________________________________________   LABS (all labs ordered are listed, but only abnormal results are displayed)  Labs Reviewed - No data to display ____________________________________________   ____________________________________________  RADIOLOGY    ____________________________________________   PROCEDURES  Procedure(s) performed: No  Procedures    ____________________________________________   INITIAL IMPRESSION / ASSESSMENT AND PLAN / ED COURSE  Pertinent labs & imaging results that were available during my care of the patient were reviewed by me and considered in my medical decision making (see chart for details).   Patient is 49 year old male presents emergency department with an adverse reaction to tramadol.  States he had a headache and vomiting.  States he had stay out of work and needs a note to go back to work.  Show some tenderness along the left flank area posteriorly.  I did explain findings to the patient.  He is agreeable to taking 2 days off of work.  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  If he is not better in 2 days he should follow-up with orthopedics.  States he understands.  Is discharged stable condition.      As part of my medical decision making, I reviewed the following data within the electronic MEDICAL RECORD NUMBER Nursing notes reviewed and incorporated, Old chart reviewed, Notes from prior ED visits and [place] Controlled Substance Database  ____________________________________________   FINAL CLINICAL IMPRESSION(S) / ED DIAGNOSES  Final diagnoses:  Acute left-sided low back pain without sciatica      NEW MEDICATIONS STARTED DURING THIS VISIT:  New Prescriptions   No medications on file     Note:  This document was prepared using Dragon voice recognition software and may include unintentional dictation errors.    Faythe Ghee, PA-C 02/27/20  1119    Delton Prairie, MD 02/27/20 1640

## 2020-02-27 NOTE — Discharge Instructions (Signed)
Follow up with your regular doctor or [facility] orthopedics if not improving in 2-3 days

## 2020-02-27 NOTE — ED Notes (Signed)
See triage note  Presents with contd back pain  States he was seen couple of days ago for same  States he developed a severe h/a after taking Tramadol  Ambulates slowly d/t pain

## 2020-04-25 ENCOUNTER — Other Ambulatory Visit: Payer: Self-pay

## 2020-04-25 ENCOUNTER — Emergency Department
Admission: EM | Admit: 2020-04-25 | Discharge: 2020-04-25 | Disposition: A | Discharge status: Home / Self Care | Payer: PRIVATE HEALTH INSURANCE | Attending: Emergency Medicine | Admitting: Emergency Medicine

## 2020-04-25 DIAGNOSIS — J45909 Unspecified asthma, uncomplicated: Secondary | ICD-10-CM | POA: Insufficient documentation

## 2020-04-25 DIAGNOSIS — Z79899 Other long term (current) drug therapy: Secondary | ICD-10-CM | POA: Insufficient documentation

## 2020-04-25 DIAGNOSIS — H10022 Other mucopurulent conjunctivitis, left eye: Secondary | ICD-10-CM | POA: Insufficient documentation

## 2020-04-25 DIAGNOSIS — F17210 Nicotine dependence, cigarettes, uncomplicated: Secondary | ICD-10-CM | POA: Diagnosis not present

## 2020-04-25 DIAGNOSIS — H5712 Ocular pain, left eye: Secondary | ICD-10-CM | POA: Diagnosis present

## 2020-04-25 MED ORDER — GENTAMICIN SULFATE 0.3 % OP SOLN: 1.0000 [drp] | Freq: Three times a day (TID) | OPHTHALMIC | 0 refills | Status: DC

## 2020-04-25 MED ORDER — HYDROXYZINE HCL 50 MG PO TABS: 50.0000 mg | ORAL_TABLET | Freq: Three times a day (TID) | ORAL | 0 refills | Status: DC | PRN

## 2020-04-25 MED ORDER — NAPHAZOLINE-PHENIRAMINE 0.025-0.3 % OP SOLN: 1.0000 [drp] | Freq: Four times a day (QID) | OPHTHALMIC | 0 refills | Status: DC | PRN

## 2020-04-25 NOTE — ED Provider Notes (Signed)
[facility] Emergency Department Provider Note   ____________________________________________   First MD Initiated Contact with Patient 04/25/20 1054     (approximate)  I have reviewed the triage vital signs and the nursing notes.   HISTORY  Chief Complaint Eye Drainage    HPI Walter Morales is a 49 y.o. male patient complain left eye pain/itching which drainage was started yesterday.  Patient waking this morning with left matted eyelids.  Patient denies use of contact lenses.  Patient denies vision change.  Patient states rhinorrhea secondary to allergies.  Patient rates his pain/discomfort as 8/10.  No palliative measure for complaint.         Past Medical History:  Diagnosis Date  . Asthma   . Migraine   . Spontaneous pneumothorax     There are no problems to display for this patient.   Past Surgical History:  Procedure Laterality Date  . ROTATOR CUFF REPAIR      Prior to Admission medications   Medication Sig Start Date End Date Taking? Authorizing Provider  albuterol (PROVENTIL HFA;VENTOLIN HFA) 108 (90 Base) MCG/ACT inhaler Inhale 2 puffs into the lungs every 6 (six) hours as needed for wheezing or shortness of breath. 09/13/18   Nita Sickle, MD  baclofen (LIORESAL) 10 MG tablet Take 1 tablet (10 mg total) by mouth 3 (three) times daily. 02/24/20 02/23/21  Fisher, Roselyn Bering, PA-C  gentamicin (GARAMYCIN) 0.3 % ophthalmic solution Place 1 drop into the right eye 3 (three) times daily. 04/25/20   Joni Reining, PA-C  hydrOXYzine (ATARAX/VISTARIL) 50 MG tablet Take 1 tablet (50 mg total) by mouth 3 (three) times daily as needed for itching. 04/25/20   Joni Reining, PA-C  meloxicam (MOBIC) 15 MG tablet Take 1 tablet (15 mg total) by mouth daily. 02/24/20 02/23/21  Fisher, Roselyn Bering, PA-C  naphazoline-pheniramine (NAPHCON-A) 0.025-0.3 % ophthalmic solution Place 1 drop into both eyes 4 (four) times daily as needed for eye irritation.  04/25/20   Joni Reining, PA-C  ranitidine (ZANTAC) 150 MG tablet Take 1 tablet (150 mg total) by mouth 2 (two) times daily. 02/07/17 09/13/18  Enid Derry, PA-C  traMADol (ULTRAM) 50 MG tablet Take 1 tablet (50 mg total) by mouth every 6 (six) hours as needed. 02/24/20   Faythe Ghee, PA-C    Allergies Patient has no known allergies.  No family history on file.  Social History Social History   Tobacco Use  . Smoking status: Current Every Day Smoker    Packs/day: 0.50    Types: Cigarettes  . Smokeless tobacco: Former Engineer, water Use Topics  . Alcohol use: No  . Drug use: No    Review of Systems Constitutional: No fever/chills Eyes: No visual changes.  Drainage from left eye and matted left eyelid. ENT: No sore throat.  Rhinorrhea. Cardiovascular: Denies chest pain. Respiratory: Denies shortness of breath. Gastrointestinal: No abdominal pain.  No nausea, no vomiting.  No diarrhea.  No constipation. Genitourinary: Negative for dysuria. Musculoskeletal: Negative for back pain. Skin: Negative for rash. Neurological: Negative for headaches, focal weakness or numbness.   ____________________________________________   PHYSICAL EXAM:  VITAL SIGNS: ED Triage Vitals  Enc Vitals Group     BP 04/25/20 0753 (!) 142/97     Pulse Rate 04/25/20 0753 69     Resp 04/25/20 0753 17     Temp 04/25/20 0753 97.6 F (36.4 C)     Temp Source 04/25/20 0753 Oral  SpO2 04/25/20 0753 99 %     Weight 04/25/20 0754 130 lb (59 kg)     Height 04/25/20 0754 5\' 8"  (1.727 m)     Head Circumference --      Peak Flow --      Pain Score 04/25/20 0754 8     Pain Loc --      Pain Edu? --      Excl. in GC? --     Constitutional: Alert and oriented. Well appearing and in no acute distress. Eyes: Left conjunctiva is erythematous.  Dry yellow-greenish secretions from left eye.04/27/20 PERRL. EOMI. Head: Atraumatic. Nose: Clear rhinorrhea. Mouth/Throat: Mucous membranes are moist.  Oropharynx  non-erythematous.  Postnasal drainage. Hematological/Lymphatic/Immunilogical: No cervical lymphadenopathy. Cardiovascular: Normal rate, regular rhythm. Grossly normal heart sounds.  Good peripheral circulation. Respiratory: Normal respiratory effort.  No retractions. Lungs CTAB. Skin:  Skin is warm, dry and intact. No rash noted. Psychiatric: Mood and affect are normal. Speech and behavior are normal.  ____________________________________________   LABS (all labs ordered are listed, but only abnormal results are displayed)  Labs Reviewed - No data to display ____________________________________________  EKG   ____________________________________________  RADIOLOGY  ED MD interpretation:    Official radiology report(s): No results found.  ____________________________________________   PROCEDURES  Procedure(s) performed (including Critical Care):  Procedures   ____________________________________________   INITIAL IMPRESSION / ASSESSMENT AND PLAN / ED COURSE  As part of my medical decision making, I reviewed the following data within the electronic MEDICAL RECORD NUMBER     Patient presents with purulent drainage from left eye.  Patient physical exam is consistent with bacterial conjunctivitis.  Patient given discharge care instruction advised take medication as directed.  Patient advised follow-up ophthalmology if no improvement 3 to 5 days.  Return to ED if condition worsens.          ____________________________________________   FINAL CLINICAL IMPRESSION(S) / ED DIAGNOSES  Final diagnoses:  Other mucopurulent conjunctivitis of left eye     ED Discharge Orders         Ordered    naphazoline-pheniramine (NAPHCON-A) 0.025-0.3 % ophthalmic solution  4 times daily PRN        04/25/20 1103    gentamicin (GARAMYCIN) 0.3 % ophthalmic solution  3 times daily        04/25/20 1103    hydrOXYzine (ATARAX/VISTARIL) 50 MG tablet  3 times daily PRN        04/25/20  1103          *Please note:  Walter Morales was evaluated in Emergency Department on 04/25/2020 for the symptoms described in the history of present illness. He was evaluated in the context of the global COVID-19 pandemic, which necessitated consideration that the patient might be at risk for infection with the SARS-CoV-2 virus that causes COVID-19. Institutional protocols and algorithms that pertain to the evaluation of patients at risk for COVID-19 are in a state of rapid change based on information released by regulatory bodies including the CDC and federal and state organizations. These policies and algorithms were followed during the patient's care in the ED.  Some ED evaluations and interventions may be delayed as a result of limited staffing during and the pandemic.*   Note:  This document was prepared using Dragon voice recognition software and may include unintentional dictation errors.    04/27/2020, PA-C 04/25/20 1110    04/27/20, MD 04/25/20 1540

## 2020-04-25 NOTE — ED Notes (Signed)
Assumed care at this time

## 2020-04-25 NOTE — Discharge Instructions (Signed)
Follow discharge care instruction use eyedrops as directed.

## 2020-04-25 NOTE — ED Triage Notes (Signed)
Pt c/o left eye pain with drainage since yesterday. Denies injury

## 2020-12-10 ENCOUNTER — Other Ambulatory Visit: Payer: Self-pay

## 2020-12-10 ENCOUNTER — Emergency Department
Admission: EM | Admit: 2020-12-10 | Discharge: 2020-12-10 | Disposition: A | Discharge status: Home / Self Care | Payer: PRIVATE HEALTH INSURANCE | Attending: Emergency Medicine | Admitting: Emergency Medicine

## 2020-12-10 DIAGNOSIS — H9209 Otalgia, unspecified ear: Secondary | ICD-10-CM | POA: Insufficient documentation

## 2020-12-10 DIAGNOSIS — R197 Diarrhea, unspecified: Secondary | ICD-10-CM | POA: Insufficient documentation

## 2020-12-10 DIAGNOSIS — U071 COVID-19: Secondary | ICD-10-CM | POA: Insufficient documentation

## 2020-12-10 DIAGNOSIS — F17210 Nicotine dependence, cigarettes, uncomplicated: Secondary | ICD-10-CM | POA: Insufficient documentation

## 2020-12-10 DIAGNOSIS — J45909 Unspecified asthma, uncomplicated: Secondary | ICD-10-CM | POA: Insufficient documentation

## 2020-12-10 LAB — RESP PANEL BY RT-PCR (FLU A&B, COVID) ARPGX2
Influenza A by PCR: NEGATIVE
Influenza B by PCR: NEGATIVE
SARS Coronavirus 2 by RT PCR: POSITIVE — AB

## 2020-12-10 NOTE — ED Notes (Signed)
Pt presents to ED with c/o of waking up and his pillow being soaking wet from sweat. Pt also states runny nose and "ear popping". Pt states he works at a [employer] where there was a lot of people sent home yesterday from work due to being COVID +. Pt denies SOB. Pt denies chest pain. NAD noted. Pt is A&Ox4.

## 2020-12-10 NOTE — ED Notes (Signed)
D/C and COVID quarantine discussed with pt, and reasons to return back to ED discussed with pt, pt verbalized understanding. NAD noted. VSS.   Pt ambulatory with steady gait on D/C.

## 2020-12-10 NOTE — Discharge Instructions (Signed)
Follow-up with your primary care provider by phone if needed.  Return to the emergency department if any severe worsening of your symptoms such as difficulty breathing or shortness of breath.  Increase fluids.  Up walking about the house often to prevent pneumonia.  You may take Tylenol or ibuprofen as needed for body aches, fever or headache.  You will need to quarantine for the next 5 days since you have already been fully vaccinated.  And note was written for your work letting them know this.

## 2020-12-10 NOTE — ED Triage Notes (Signed)
Pt c/o a runny nose and today when he yawned its liked they are clogged up, also c/o having night sweats last night. States they sent home a lot of his coworkers recently with covid

## 2020-12-10 NOTE — ED Provider Notes (Signed)
[facility] Emergency Department Provider Note   ____________________________________________   Event Date/Time   First MD Initiated Contact with Patient 12/10/20 [phone]     (approximate)  I have reviewed the triage vital signs and the nursing notes.   HISTORY  Chief Complaint URI    HPI Walter Morales is a 50 y.o. male presents to the ED with complaint of rhinorrhea and ear pain.  Patient states that he woke last night and noticed his pillow and sheets were wet with sweat.  He is unaware of any fever and denies chills.  Patient does report that he had some diarrhea yesterday but no nausea or vomiting.  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  Apparently one coworker called to let them know that he was positive and the HR person got tested and began testing people.  At the end of this patient shift he states that there was only 4 people in the area that he works and that had not been tested but went home.  Rates his pain as an 8 out of 10.       Past Medical History:  Diagnosis Date  . Asthma   . Migraine   . Spontaneous pneumothorax     There are no problems to display for this patient.   Past Surgical History:  Procedure Laterality Date  . ROTATOR CUFF REPAIR      Prior to Admission medications   Medication Sig Start Date End Date Taking? Authorizing Provider  albuterol (PROVENTIL HFA;VENTOLIN HFA) 108 (90 Base) MCG/ACT inhaler Inhale 2 puffs into the lungs every 6 (six) hours as needed for wheezing or shortness of breath. 09/13/18   Nita Sickle, MD  ranitidine (ZANTAC) 150 MG tablet Take 1 tablet (150 mg total) by mouth 2 (two) times daily. 02/07/17 12/10/20  Enid Derry, PA-C    Allergies Patient has no known allergies.  No family history on file.  Social History Social History   Tobacco Use  . Smoking status: Current Every Day Smoker    Packs/day: 0.50     Types: Cigarettes  . Smokeless tobacco: Former Engineer, water Use Topics  . Alcohol use: No  . Drug use: No    Review of Systems Constitutional: No fever/chills Eyes: No visual changes. ENT: No sore throat.  Positive for ear pain. Cardiovascular: Denies chest pain. Respiratory: Denies shortness of breath.  Negative for cough. Gastrointestinal: No abdominal pain.  No nausea, no vomiting.  Positive diarrhea.   Genitourinary: Negative for dysuria. Musculoskeletal: Negative for back pain. Skin: Negative for rash. Neurological: Negative for headaches, focal weakness or numbness.  ____________________________________________   PHYSICAL EXAM:  VITAL SIGNS: ED Triage Vitals  Enc Vitals Group     BP 12/10/20 0748 127/77     Pulse Rate 12/10/20 0748 (!) 55     Resp 12/10/20 0748 17     Temp 12/10/20 0748 97.8 F (36.6 C)     Temp Source 12/10/20 0748 Oral     SpO2 12/10/20 0748 99 %     Weight 12/10/20 0750 115 lb (52.2 kg)     Height 12/10/20 0750 5\' 9"  (1.753 m)     Head Circumference --      Peak Flow --      Pain Score 12/10/20 0750 8     Pain Loc --      Pain Edu? --  Excl. in GC? --     Constitutional: Alert and oriented. Well appearing and in no acute distress. Eyes: Conjunctivae are normal. PERRL. EOMI. Head: Atraumatic. Nose: Mild congestion/rhinnorhea.   Ears: EACs are clear.  TMs are dull but no injection or erythema is noted.  Poor light reflex is present. Neck: No stridor.   Cardiovascular: Normal rate, regular rhythm. Grossly normal heart sounds.  Good peripheral circulation. Respiratory: Normal respiratory effort.  No retractions. Lungs CTAB. Gastrointestinal: Soft and nontender. No distention.  Musculoskeletal: Moves upper and lower extremities without any difficulty.  Normal gait was noted.  No joint pain or swelling is noted. Neurologic:  Normal speech and language. No gross focal neurologic deficits are appreciated. No gait instability. Skin:  Skin  is warm, dry and intact. No rash noted. Psychiatric: Mood and affect are normal. Speech and behavior are normal.  ____________________________________________   LABS (all labs ordered are listed, but only abnormal results are displayed)  Labs Reviewed  RESP PANEL BY RT-PCR (FLU A&B, COVID) ARPGX2 - Abnormal; Notable for the following components:      Result Value   SARS Coronavirus 2 by RT PCR POSITIVE (*)    All other components within normal limits   ____________________________________________   PROCEDURES  Procedure(s) performed (including Critical Care):  Procedures   ____________________________________________   INITIAL IMPRESSION / ASSESSMENT AND PLAN / ED COURSE  As part of my medical decision making, I reviewed the following data within the electronic MEDICAL RECORD NUMBER Notes from prior ED visits and [place] Controlled Substance Database  50 year old male presents to the ED with complaint of questionable night sweating, rhinorrhea and ear pain.  Patient states that he works at [employer] where a lot of people were sent home because they had positive COVID test yesterday.  He denies any shortness of breath, chest pain or cough at this time.  Patient is vaccinated with the Moderna vaccine x2.  Physical exam is benign with exception of some congestion.  COVID test was positive and patient was made aware.  Note for his company letting them know that he is positive and needs to quarantine for 5 days since he is vaccinated.  He is encouraged to drink fluids and take Tylenol or ibuprofen if needed for pain, headache, body aches or fever.  He is aware that he needs to return to the emergency department if he develops any worsening of his breathing or shortness of breath.  ____________________________________________   FINAL CLINICAL IMPRESSION(S) / ED DIAGNOSES  Final diagnoses:  COVID-19     ED Discharge Orders    None      *Please note:  DEMERIUS PODOLAK was evaluated in  Emergency Department on 12/10/2020 for the symptoms described in the history of present illness. He was evaluated in the context of the global COVID-19 pandemic, which necessitated consideration that the patient might be at risk for infection with the SARS-CoV-2 virus that causes COVID-19. Institutional protocols and algorithms that pertain to the evaluation of patients at risk for COVID-19 are in a state of rapid change based on information released by regulatory bodies including the CDC and federal and state organizations. These policies and algorithms were followed during the patient's care in the ED.  Some ED evaluations and interventions may be delayed as a result of limited staffing during and the pandemic.*   Note:  This document was prepared using Dragon voice recognition software and may include unintentional dictation errors.    Tommi Rumps, PA-C 12/10/20  1028    Chesley Noon, MD 12/11/20 [phone]

## 2021-04-10 IMAGING — CR DG LUMBAR SPINE 2-3V
3 series · 3 of 3 positions shown · non-contrast
Comparison: CT abdomen pelvis 12/15/2016

CLINICAL DATA: Low left sided back pain beginning yesterday that
radiates into left hip and down left leg. Pain with ambulation. No
known injury. No surgical hx to lumbar spine.

EXAM:
LUMBAR SPINE - 2-3 VIEW

[l-spine ap]
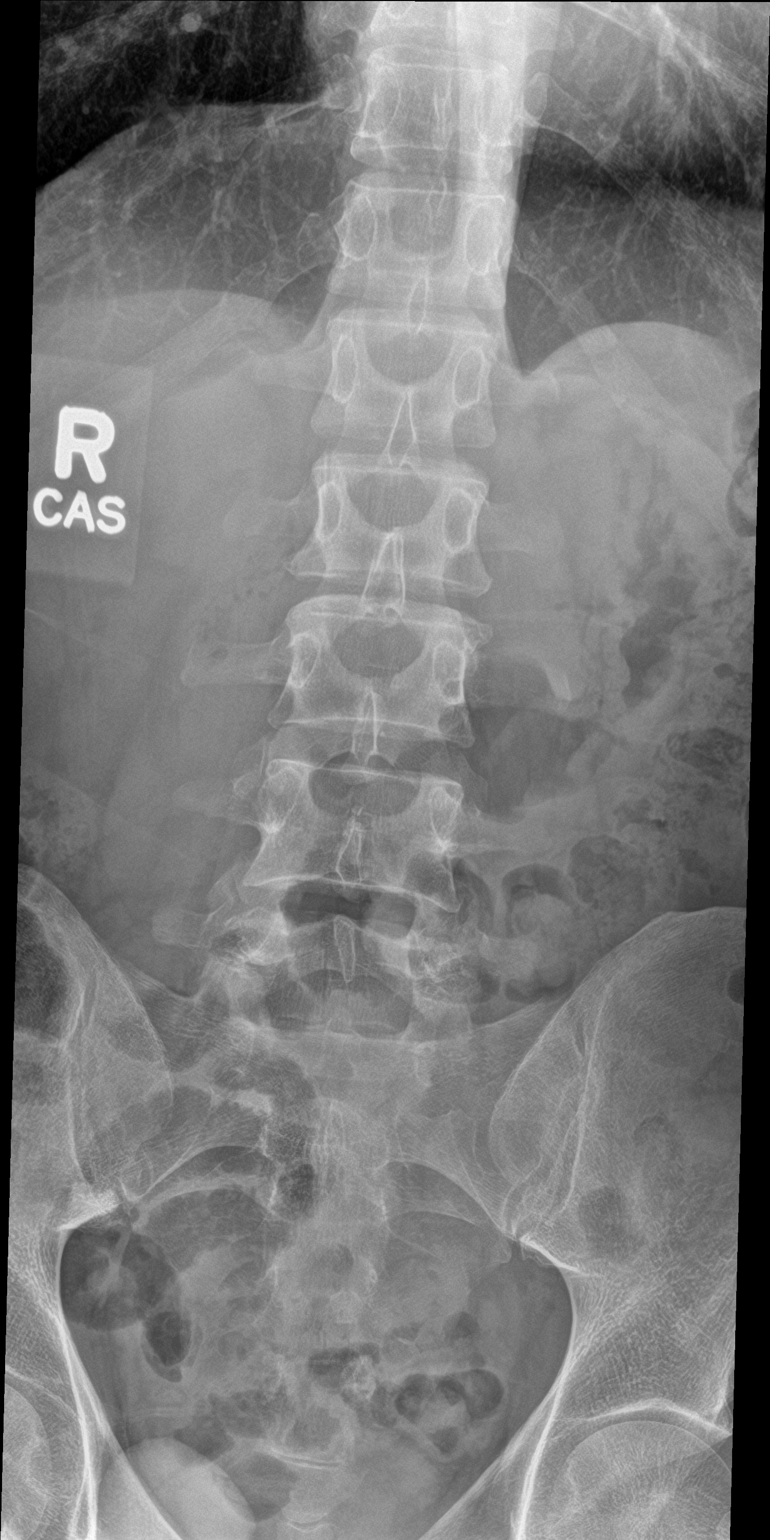

[l-spine lat]
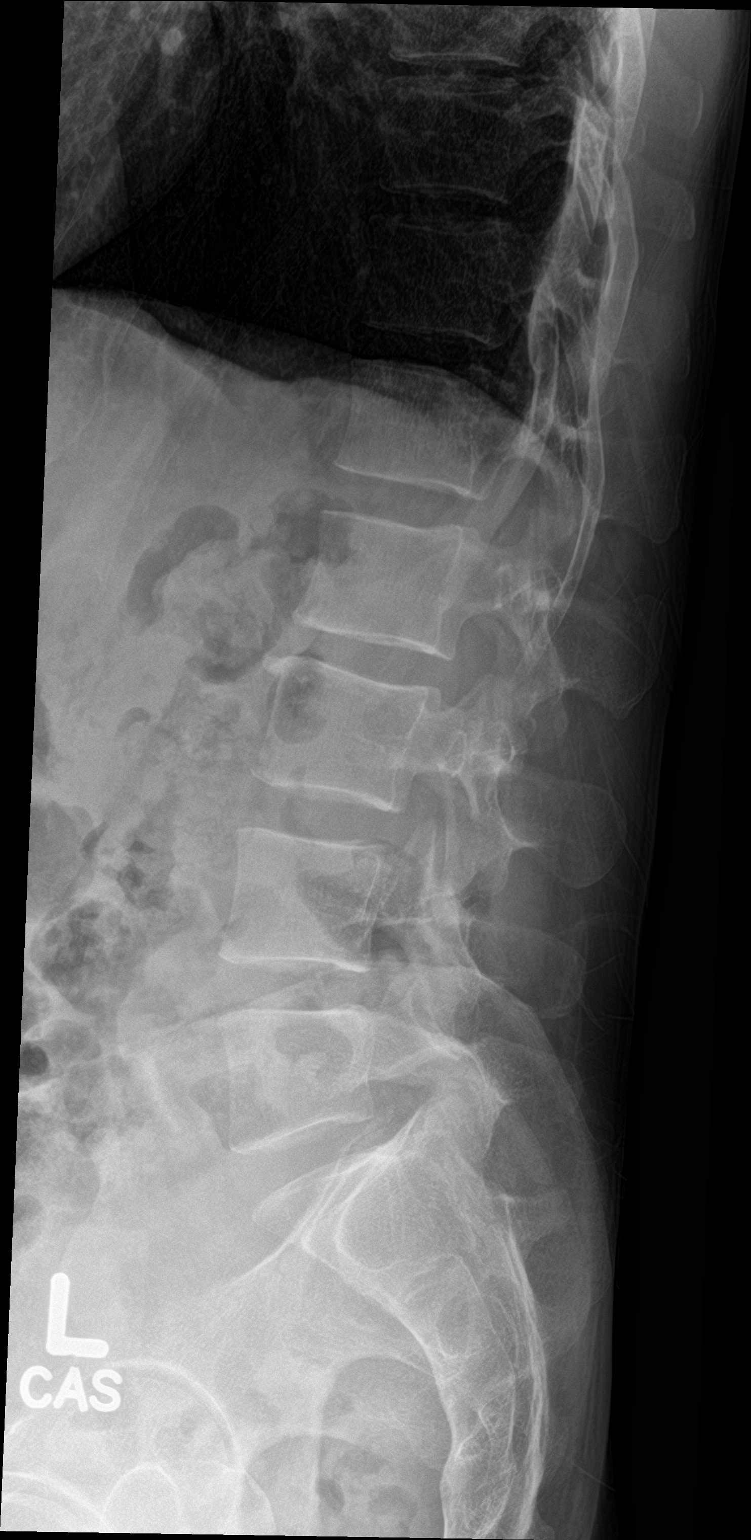

[l-spine spot]
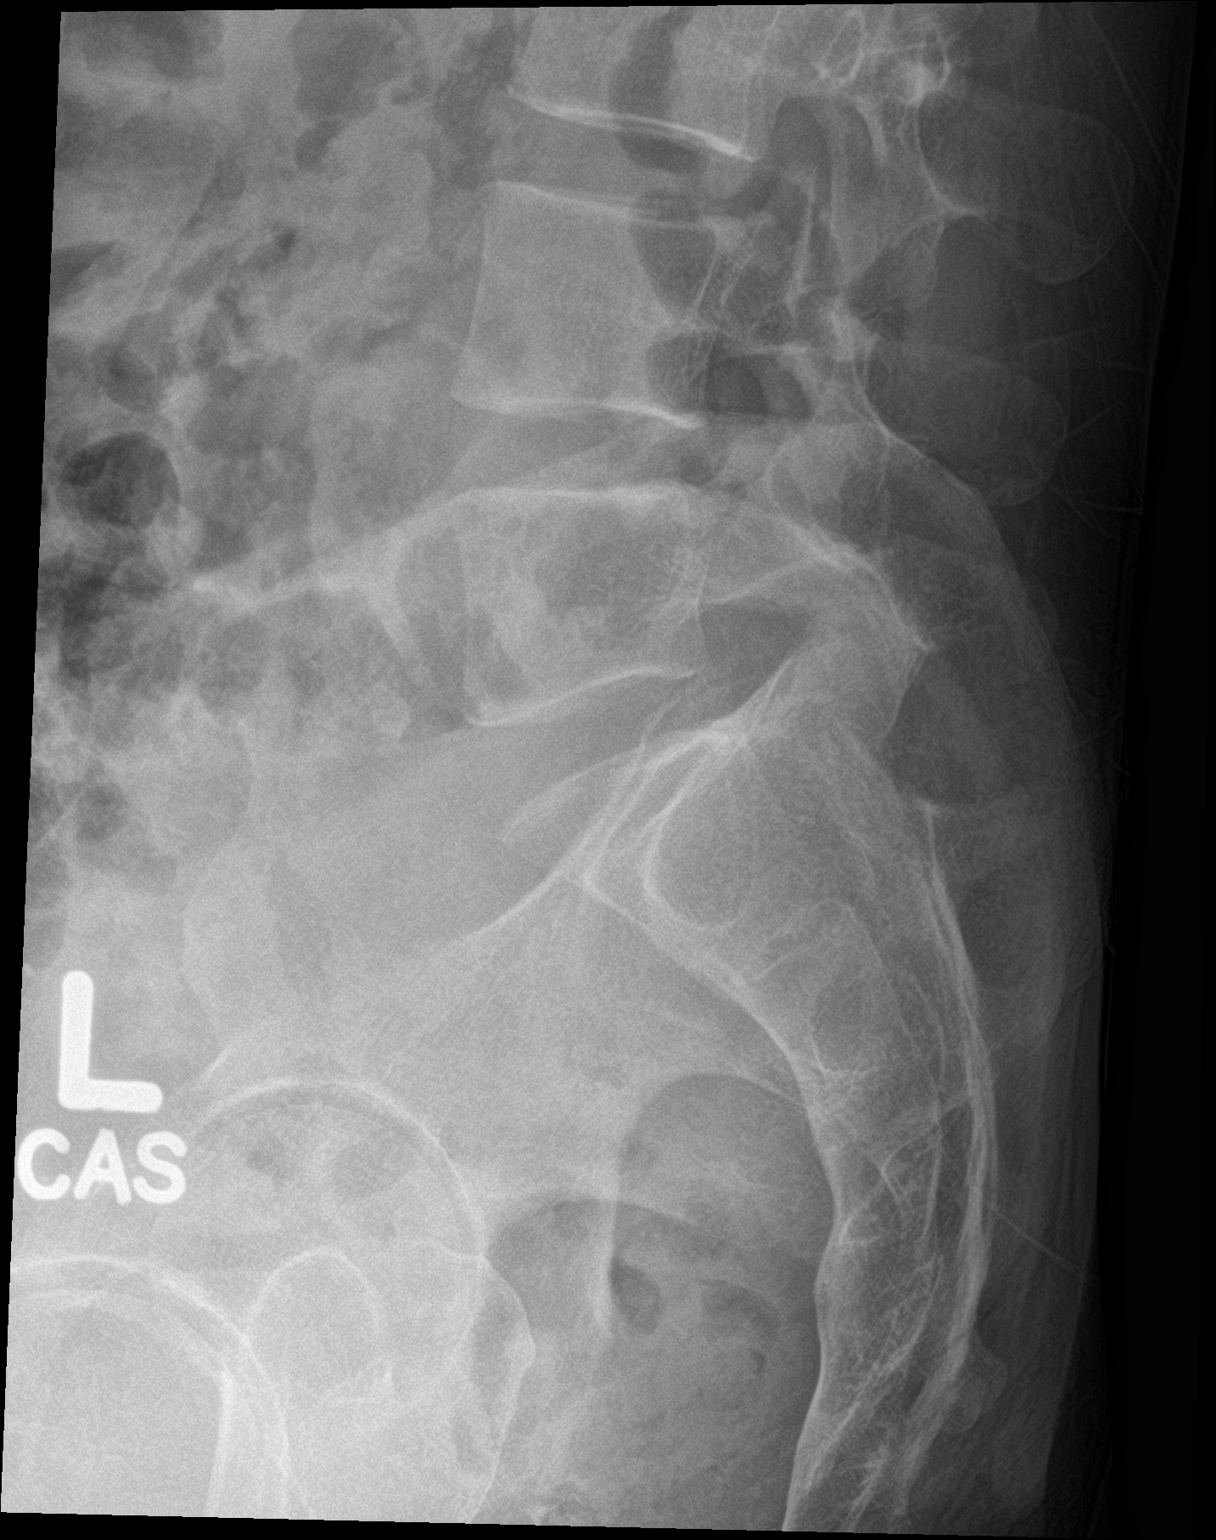

[3 of 3 positions shown; findings below may reference images not displayed]

FINDINGS: There is no evidence of lumbar spine fracture. Alignment is normal.
Intervertebral disc spaces are maintained. SI joints are open and
symmetric.
IMPRESSION: Negative lumbar spine radiographs.
